# Patient Record
Sex: Female | Born: 1970 | Race: White | Hispanic: No | Marital: Married | Smoking: Former smoker
Health system: Southern US, Community
[De-identification: ages and names within clinical notes are randomized; demographics above are authoritative.]

## PROBLEM LIST (undated history)

## (undated) DIAGNOSIS — J209 Acute bronchitis, unspecified: Secondary | ICD-10-CM

## (undated) HISTORY — DX: Acute bronchitis, unspecified: J20.9

## (undated) HISTORY — PX: CERVICAL DISCECTOMY: SHX98

---

## 1997-10-20 ENCOUNTER — Inpatient Hospital Stay (HOSPITAL_COMMUNITY): Admission: AD | Admit: 1997-10-20 | Discharge: 1997-10-20 | Payer: Self-pay | Admitting: *Deleted

## 1997-10-23 ENCOUNTER — Inpatient Hospital Stay (HOSPITAL_COMMUNITY): Admission: AD | Admit: 1997-10-23 | Discharge: 1997-10-26 | Payer: Self-pay | Admitting: Obstetrics and Gynecology

## 1997-12-01 ENCOUNTER — Other Ambulatory Visit: Admission: RE | Admit: 1997-12-01 | Discharge: 1997-12-01 | Payer: Self-pay | Admitting: Obstetrics and Gynecology

## 1999-02-10 ENCOUNTER — Ambulatory Visit (HOSPITAL_COMMUNITY): Admission: RE | Admit: 1999-02-10 | Discharge: 1999-02-10 | Payer: Self-pay | Admitting: Obstetrics and Gynecology

## 1999-02-28 ENCOUNTER — Other Ambulatory Visit: Admission: RE | Admit: 1999-02-28 | Discharge: 1999-02-28 | Payer: Self-pay | Admitting: Obstetrics and Gynecology

## 1999-08-05 ENCOUNTER — Other Ambulatory Visit: Admission: RE | Admit: 1999-08-05 | Discharge: 1999-08-05 | Payer: Self-pay | Admitting: Obstetrics and Gynecology

## 2000-01-24 ENCOUNTER — Inpatient Hospital Stay (HOSPITAL_COMMUNITY): Admission: AD | Admit: 2000-01-24 | Discharge: 2000-01-26 | Payer: Self-pay | Admitting: Obstetrics and Gynecology

## 2000-02-23 ENCOUNTER — Other Ambulatory Visit: Admission: RE | Admit: 2000-02-23 | Discharge: 2000-02-23 | Payer: Self-pay | Admitting: Obstetrics and Gynecology

## 2002-03-13 ENCOUNTER — Other Ambulatory Visit: Admission: RE | Admit: 2002-03-13 | Discharge: 2002-03-13 | Payer: Self-pay | Admitting: Obstetrics and Gynecology

## 2002-09-29 ENCOUNTER — Inpatient Hospital Stay (HOSPITAL_COMMUNITY): Admission: AD | Admit: 2002-09-29 | Discharge: 2002-09-29 | Payer: Self-pay | Admitting: Obstetrics and Gynecology

## 2002-10-03 ENCOUNTER — Inpatient Hospital Stay (HOSPITAL_COMMUNITY): Admission: AD | Admit: 2002-10-03 | Discharge: 2002-10-05 | Payer: Self-pay | Admitting: Obstetrics and Gynecology

## 2002-10-31 ENCOUNTER — Other Ambulatory Visit: Admission: RE | Admit: 2002-10-31 | Discharge: 2002-10-31 | Payer: Self-pay | Admitting: Obstetrics and Gynecology

## 2006-02-12 ENCOUNTER — Ambulatory Visit: Payer: Self-pay | Admitting: Family Medicine

## 2018-06-03 ENCOUNTER — Encounter: Payer: Self-pay | Admitting: Emergency Medicine

## 2018-06-03 ENCOUNTER — Emergency Department: Payer: BC Managed Care – PPO

## 2018-06-03 ENCOUNTER — Emergency Department
Admission: EM | Admit: 2018-06-03 | Discharge: 2018-06-03 | Disposition: A | Discharge status: Home / Self Care | Payer: BC Managed Care – PPO | Attending: Emergency Medicine | Admitting: Emergency Medicine

## 2018-06-03 DIAGNOSIS — J189 Pneumonia, unspecified organism: Secondary | ICD-10-CM | POA: Insufficient documentation

## 2018-06-03 DIAGNOSIS — J181 Lobar pneumonia, unspecified organism: Secondary | ICD-10-CM

## 2018-06-03 DIAGNOSIS — R05 Cough: Secondary | ICD-10-CM | POA: Diagnosis present

## 2018-06-03 LAB — CBC WITH DIFFERENTIAL/PLATELET
Abs Immature Granulocytes: 0.09 10*3/uL — ABNORMAL HIGH (ref 0.00–0.07)
BASOS ABS: 0.1 10*3/uL (ref 0.0–0.1)
Basophils Relative: 1 %
EOS ABS: 1 10*3/uL — AB (ref 0.0–0.5)
EOS PCT: 8 %
HCT: 42.1 % (ref 36.0–46.0)
Hemoglobin: 13.3 g/dL (ref 12.0–15.0)
IMMATURE GRANULOCYTES: 1 %
Lymphocytes Relative: 16 %
Lymphs Abs: 1.9 10*3/uL (ref 0.7–4.0)
MCH: 26.8 pg (ref 26.0–34.0)
MCHC: 31.6 g/dL (ref 30.0–36.0)
MCV: 84.9 fL (ref 80.0–100.0)
Monocytes Absolute: 0.6 10*3/uL (ref 0.1–1.0)
Monocytes Relative: 5 %
NEUTROS PCT: 69 %
NRBC: 0 % (ref 0.0–0.2)
Neutro Abs: 8.1 10*3/uL — ABNORMAL HIGH (ref 1.7–7.7)
Platelets: 310 10*3/uL (ref 150–400)
RBC: 4.96 MIL/uL (ref 3.87–5.11)
RDW: 14.3 % (ref 11.5–15.5)
WBC: 11.8 10*3/uL — AB (ref 4.0–10.5)

## 2018-06-03 LAB — COMPREHENSIVE METABOLIC PANEL
ALBUMIN: 4.6 g/dL (ref 3.5–5.0)
ALT: 15 U/L (ref 0–44)
AST: 19 U/L (ref 15–41)
Alkaline Phosphatase: 58 U/L (ref 38–126)
Anion gap: 9 (ref 5–15)
BUN: 10 mg/dL (ref 6–20)
CALCIUM: 9.4 mg/dL (ref 8.9–10.3)
CO2: 25 mmol/L (ref 22–32)
Chloride: 105 mmol/L (ref 98–111)
Creatinine, Ser: 0.56 mg/dL (ref 0.44–1.00)
GFR calc Af Amer: >60 mL/min (ref >60)
GFR calc non Af Amer: >60 mL/min (ref >60)
Glucose, Bld: 105 mg/dL — ABNORMAL HIGH (ref 70–99)
POTASSIUM: 3.9 mmol/L (ref 3.5–5.1)
SODIUM: 139 mmol/L (ref 135–145)
TOTAL PROTEIN: 7.9 g/dL (ref 6.5–8.1)
Total Bilirubin: 1.1 mg/dL (ref 0.3–1.2)

## 2018-06-03 MED ORDER — LEVOFLOXACIN 750 MG PO TABS: 750.0000 mg | ORAL_TABLET | Freq: Every day | ORAL | 0 refills | Status: AC

## 2018-06-03 MED ORDER — IPRATROPIUM-ALBUTEROL 0.5-2.5 (3) MG/3ML IN SOLN
3.0000 mL | Freq: Once | RESPIRATORY_TRACT | Status: AC
  Administered 2018-06-03: 3 mL via RESPIRATORY_TRACT
  Filled 2018-06-03: qty 3

## 2018-06-03 MED ORDER — ALBUTEROL SULFATE HFA 108 (90 BASE) MCG/ACT IN AERS: 2.0000 | INHALATION_SPRAY | Freq: Four times a day (QID) | RESPIRATORY_TRACT | 2 refills | Status: DC | PRN

## 2018-06-03 MED ORDER — HYDROCOD POLST-CPM POLST ER 10-8 MG/5ML PO SUER: 5.0000 mL | Freq: Two times a day (BID) | ORAL | 0 refills | Status: DC | PRN

## 2018-06-03 MED ORDER — HYDROCOD POLST-CPM POLST ER 10-8 MG/5ML PO SUER
5.0000 mL | Freq: Once | ORAL | Status: AC
  Administered 2018-06-03: 5 mL via ORAL
  Filled 2018-06-03: qty 5

## 2018-06-03 NOTE — ED Provider Notes (Signed)
[facility] Emergency Department Provider Note  ____________________________________________   First MD Initiated Contact with Patient 06/03/18 1046     (approximate)  I have reviewed the triage vital signs and the nursing notes.   HISTORY  Chief Complaint Cough   HPI Sydney Hobbs is a 47 y.o. female patient presents to the ED with complaint of cough that started approximately 2 months ago.  She states that she was treated with antibiotics twice (Zithromax) and prednisone twice at the urgent care.  She was told by the provider that she had pneumonia on her x-ray.  She states that she began feeling better after the antibiotic but then when she discontinue the antibiotics the cough returned.  Patient called the urgent care was told to come to the ED for evaluation and treatment.  Patient denies any fever and night sweats.  Patient is a former smoker.    History reviewed. No pertinent past medical history.  There are no active problems to display for this patient.    Prior to Admission medications   Medication Sig Start Date End Date Taking? Authorizing Provider  albuterol (PROVENTIL HFA;VENTOLIN HFA) 108 (90 Base) MCG/ACT inhaler Inhale 2 puffs into the lungs every 6 (six) hours as needed for wheezing or shortness of breath. 06/03/18   Tommi Rumps, PA-C  chlorpheniramine-HYDROcodone (TUSSIONEX PENNKINETIC ER) 10-8 MG/5ML SUER Take 5 mLs by mouth every 12 (twelve) hours as needed for cough. 06/03/18   Tommi Rumps, PA-C  levofloxacin (LEVAQUIN) 750 MG tablet Take 1 tablet (750 mg total) by mouth daily for 7 days. 06/03/18 06/10/18  Tommi Rumps, PA-C    Allergies Penicillins  No family history on file.  Social History Social History   Tobacco Use  . Smoking status: Not on file  Substance Use Topics  . Alcohol use: Not on file  . Drug use: Not on file    Review of Systems Constitutional: No fever/chills Eyes: No visual changes. ENT:  No sore throat. Cardiovascular: Denies chest pain. Respiratory: Denies shortness of breath.  Positive cough. Gastrointestinal: No abdominal pain.  No nausea, no vomiting. Musculoskeletal: Negative for back pain. Skin: Negative for rash. Neurological: Negative for headaches, focal weakness or numbness. ___________________________________________   PHYSICAL EXAM:  VITAL SIGNS: ED Triage Vitals  Enc Vitals Group     BP 06/03/18 1028 132/87     Pulse Rate 06/03/18 1028 (!) 114     Resp 06/03/18 1028 20     Temp 06/03/18 1028 98.4 F (36.9 C)     Temp Source 06/03/18 1028 Oral     SpO2 06/03/18 1028 95 %     Weight 06/03/18 1029 202 lb (91.6 kg)     Height 06/03/18 1029 5\' 9"  (1.753 m)     Head Circumference --      Peak Flow --      Pain Score 06/03/18 1031 5     Pain Loc --      Pain Edu? --      Excl. in GC? --    Constitutional: Alert and oriented. Well appearing and in no acute distress. Eyes: Conjunctivae are normal.  Head: Atraumatic. Nose: No congestion/rhinnorhea.  Mouth/Throat: Mucous membranes are moist.  Oropharynx non-erythematous. Neck: No stridor.   Hematological/Lymphatic/Immunilogical: No cervical lymphadenopathy. Cardiovascular: Normal rate, regular rhythm. Grossly normal heart sounds.  Good peripheral circulation. Respiratory: Normal respiratory effort.  No retractions. Lungs no rales or rhonchi are noted however patient is noted to be coughing  almost suggestive of bronchospasm.  Patient is able talking complete sentences without any difficulties.  No wheezing is appreciated. Gastrointestinal: Soft and nontender. No distention.  Musculoskeletal: Moves upper and lower extremities without any difficulty.  Normal gait was noted. Neurologic:  Normal speech and language. No gross focal neurologic deficits are appreciated. No gait instability. Skin:  Skin is warm, dry and intact. No rash noted. Psychiatric: Mood and affect are normal. Speech and behavior are  normal.  ____________________________________________   LABS (all labs ordered are listed, but only abnormal results are displayed)  Labs Reviewed  CBC WITH DIFFERENTIAL/PLATELET - Abnormal; Notable for the following components:      Result Value   WBC 11.8 (*)    Neutro Abs 8.1 (*)    Eosinophils Absolute 1.0 (*)    Abs Immature Granulocytes 0.09 (*)    All other components within normal limits  COMPREHENSIVE METABOLIC PANEL - Abnormal; Notable for the following components:   Glucose, Bld 105 (*)    All other components within normal limits    RADIOLOGY  ED MD interpretation:  Chest x-ray was viewed and report reviewed from radiologist.  Official radiology report(s): Dg Chest 2 View  Result Date: 06/03/2018 CLINICAL DATA:  Cough since October. EXAM: CHEST - 2 VIEW COMPARISON:  None. FINDINGS: Lungs are adequately inflated with subtle density over the anterior left base which may be due to atelectasis versus infection. No effusion. Cardiomediastinal silhouette and remainder the exam is unremarkable. IMPRESSION: Minimal opacification over the anterior left base which may be due to atelectasis or infection. Electronically Signed   By: Elberta Fortis M.D.   On: 06/03/2018 12:14   ____________________________________________   PROCEDURES  Procedure(s) performed: None  Procedures  Critical Care performed: No  ____________________________________________   INITIAL IMPRESSION / ASSESSMENT AND PLAN / ED COURSE  As part of my medical decision making, I reviewed the following data within the electronic MEDICAL RECORD NUMBER Notes from prior ED visits and [place] Controlled Substance Database  Patient presents to the ED with complaint of semi-productive cough for 1 month.  She initially was seen at an urgent care and treated with Zithromax twice and prednisone.  She states that she felt better while taking the antibiotic but the cough returned after finishing the course of antibiotics.   Patient was given an nebulizer treatment while in the ED which helped with her cough along with Tussionex.  Chest x-ray was suggestive of an infiltrate in the left lower lobe.  WBC is slightly elevated at 11.8.  Patient's cough was under control at the time of discharge and patient was feeling better.  She was given a prescription for an albuterol inhaler, Levaquin 750 g daily for 7 days and Tussionex.  She is to follow-up with her PCP if any continued problems she may also return to the emergency department if any severe worsening of her symptoms.   ____________________________________________   FINAL CLINICAL IMPRESSION(S) / ED DIAGNOSES  Final diagnoses:  Community acquired pneumonia of left lower lobe of lung [facility])     ED Discharge Orders         Ordered    chlorpheniramine-HYDROcodone (TUSSIONEX PENNKINETIC ER) 10-8 MG/5ML SUER  Every 12 hours PRN     06/03/18 1318    levofloxacin (LEVAQUIN) 750 MG tablet  Daily     06/03/18 1325    albuterol (PROVENTIL HFA;VENTOLIN HFA) 108 (90 Base) MCG/ACT inhaler  Every 6 hours PRN     06/03/18 1325  Note:  This document was prepared using Dragon voice recognition software and may include unintentional dictation errors.    Tommi RumpsSummers, Clairessa Boulet L, PA-C 06/03/18 1819    Emily FilbertWilliams, Jonathan E, MD 06/04/18 [phone]

## 2018-06-03 NOTE — Discharge Instructions (Signed)
Follow-up with your primary care provider if any continued problems or [facility] acute [facility].  Begin taking Levaquin 750 mg daily for the next 7 days.  Also continue your albuterol inhaler 2 puffs 4 times a day as needed for coughing or shortness of breath.  Continue the Tussionex that you have at home and also get the prescription filled if you need more.  If not improving your primary care provider may need to order more testing such as a CT scan of your chest.  Return to the emergency department if any severe worsening of your symptoms.

## 2018-06-03 NOTE — ED Triage Notes (Signed)
Pt reports on going cough since October. Has been treated with abx, pt feels fine after abx and then when they stop the cough comes back. Pt denies fever.

## 2018-06-03 NOTE — ED Notes (Signed)
See triage note  Presents with a 2 month hx of cough  States she has been seen for same couple of times  Feels better for short while then sx's return

## 2018-06-12 ENCOUNTER — Emergency Department: Payer: BC Managed Care – PPO

## 2018-06-12 ENCOUNTER — Encounter: Payer: Self-pay | Admitting: Emergency Medicine

## 2018-06-12 ENCOUNTER — Emergency Department
Admission: EM | Admit: 2018-06-12 | Discharge: 2018-06-12 | Disposition: A | Discharge status: Home / Self Care | Payer: BC Managed Care – PPO | Attending: Emergency Medicine | Admitting: Emergency Medicine

## 2018-06-12 ENCOUNTER — Other Ambulatory Visit: Payer: Self-pay

## 2018-06-12 DIAGNOSIS — J40 Bronchitis, not specified as acute or chronic: Secondary | ICD-10-CM | POA: Insufficient documentation

## 2018-06-12 DIAGNOSIS — R0602 Shortness of breath: Secondary | ICD-10-CM | POA: Diagnosis present

## 2018-06-12 DIAGNOSIS — Z87891 Personal history of nicotine dependence: Secondary | ICD-10-CM | POA: Insufficient documentation

## 2018-06-12 DIAGNOSIS — R059 Cough, unspecified: Secondary | ICD-10-CM

## 2018-06-12 DIAGNOSIS — R05 Cough: Secondary | ICD-10-CM

## 2018-06-12 LAB — COMPREHENSIVE METABOLIC PANEL
ALT: 18 U/L (ref 0–44)
AST: 23 U/L (ref 15–41)
Albumin: 4.3 g/dL (ref 3.5–5.0)
Alkaline Phosphatase: 48 U/L (ref 38–126)
Anion gap: 7 (ref 5–15)
BUN: 8 mg/dL (ref 6–20)
CO2: 25 mmol/L (ref 22–32)
CREATININE: 0.65 mg/dL (ref 0.44–1.00)
Calcium: 8.9 mg/dL (ref 8.9–10.3)
Chloride: 106 mmol/L (ref 98–111)
GFR calc Af Amer: >60 mL/min (ref >60)
GFR calc non Af Amer: >60 mL/min (ref >60)
Glucose, Bld: 104 mg/dL — ABNORMAL HIGH (ref 70–99)
Potassium: 3.4 mmol/L — ABNORMAL LOW (ref 3.5–5.1)
Sodium: 138 mmol/L (ref 135–145)
Total Bilirubin: 1.1 mg/dL (ref 0.3–1.2)
Total Protein: 7.2 g/dL (ref 6.5–8.1)

## 2018-06-12 LAB — TROPONIN I: Troponin I: <0.03 ng/mL (ref <0.03)

## 2018-06-12 LAB — CBC WITH DIFFERENTIAL/PLATELET
Abs Immature Granulocytes: 0.06 10*3/uL (ref 0.00–0.07)
Basophils Absolute: 0.2 10*3/uL — ABNORMAL HIGH (ref 0.0–0.1)
Basophils Relative: 1 %
Eosinophils Absolute: 2.4 10*3/uL — ABNORMAL HIGH (ref 0.0–0.5)
Eosinophils Relative: 18 %
HCT: 38.2 % (ref 36.0–46.0)
Hemoglobin: 12 g/dL (ref 12.0–15.0)
Immature Granulocytes: 0 %
Lymphocytes Relative: 22 %
Lymphs Abs: 2.9 10*3/uL (ref 0.7–4.0)
MCH: 26.7 pg (ref 26.0–34.0)
MCHC: 31.4 g/dL (ref 30.0–36.0)
MCV: 84.9 fL (ref 80.0–100.0)
Monocytes Absolute: 0.7 10*3/uL (ref 0.1–1.0)
Monocytes Relative: 6 %
NEUTROS PCT: 53 %
Neutro Abs: 7.1 10*3/uL (ref 1.7–7.7)
Platelets: 268 10*3/uL (ref 150–400)
RBC: 4.5 MIL/uL (ref 3.87–5.11)
RDW: 14.2 % (ref 11.5–15.5)
WBC: 13.4 10*3/uL — ABNORMAL HIGH (ref 4.0–10.5)
nRBC: 0 % (ref 0.0–0.2)

## 2018-06-12 LAB — CG4 I-STAT (LACTIC ACID): Lactic Acid, Venous: 0.75 mmol/L (ref 0.5–1.9)

## 2018-06-12 MED ORDER — ALBUTEROL SULFATE HFA 108 (90 BASE) MCG/ACT IN AERS: 2.0000 | INHALATION_SPRAY | Freq: Four times a day (QID) | RESPIRATORY_TRACT | 0 refills | Status: DC | PRN

## 2018-06-12 MED ORDER — IPRATROPIUM-ALBUTEROL 0.5-2.5 (3) MG/3ML IN SOLN
3.0000 mL | Freq: Once | RESPIRATORY_TRACT | Status: AC
  Administered 2018-06-12: 3 mL via RESPIRATORY_TRACT
  Filled 2018-06-12: qty 3

## 2018-06-12 MED ORDER — PREDNISONE 10 MG PO TABS: 10.0000 mg | ORAL_TABLET | Freq: Every day | ORAL | 0 refills | Status: DC

## 2018-06-12 MED ORDER — METHYLPREDNISOLONE SODIUM SUCC 125 MG IJ SOLR
125.0000 mg | Freq: Once | INTRAMUSCULAR | Status: AC
  Administered 2018-06-12: 125 mg via INTRAVENOUS
  Filled 2018-06-12: qty 2

## 2018-06-12 MED ORDER — IOHEXOL 350 MG/ML SOLN
75.0000 mL | Freq: Once | INTRAVENOUS | Status: AC | PRN
  Administered 2018-06-12: 75 mL via INTRAVENOUS

## 2018-06-12 NOTE — ED Triage Notes (Signed)
Patient ambulatory to triage with steady gait, without difficulty or distress noted, mask in place; pt reports persistent prod cough yellow sputum; has completed 2 rounds of steroids and z-pak, 1 levaquin without relief of cough and SHOB; denies fever, reports upper chest soreness from coughing

## 2018-06-12 NOTE — Discharge Instructions (Addendum)
Please take your medications as prescribed.  Follow-up with your doctor in 1 week for recheck/reevaluation.  Return to the emergency department for any chest pain worsening trouble breathing.

## 2018-06-12 NOTE — ED Provider Notes (Signed)
[facility] Emergency Department Provider Note  Time seen: 9:33 PM  I have reviewed the triage vital signs and the nursing notes.   HISTORY  Chief Complaint Cough    HPI Sydney Hobbs is a 47 y.o. female with no significant past medical history presents to the emergency department for cough shortness of breath and chest pain.  According to the patient beginning in October she developed cough and fever was initially diagnosed with possible influenza by her doctor and started on Tamiflu.  Patient states she continued to cough throughout the month of October, was seen again by her doctor 1 November and was started on Zithromax.  States 2 weeks later was not improved so her doctor wrote her another course of Zithromax.  Patient states the cough and shortness of breath continued she was seen in the emergency department 2 weeks ago prescribed Levaquin.  Patient states she continues to have cough and shortness of breath so she came to the emergency department today for evaluation.  Denies any recent fevers.  Denies any nausea vomiting or diarrhea.  States frequent cough shortness of breath worse with any type of exertion.  Denies any lower extremity swelling.  Does not take estrogen, no history of DVT or PE.   History reviewed. No pertinent past medical history.  There are no active problems to display for this patient.   Past Surgical History:  Procedure Laterality Date  . CERVICAL DISCECTOMY      Prior to Admission medications   Medication Sig Start Date End Date Taking? Authorizing Provider  albuterol (PROVENTIL HFA;VENTOLIN HFA) 108 (90 Base) MCG/ACT inhaler Inhale 2 puffs into the lungs every 6 (six) hours as needed for wheezing or shortness of breath. 06/03/18   Tommi RumpsSummers, Rhonda L, PA-C  chlorpheniramine-HYDROcodone (TUSSIONEX PENNKINETIC ER) 10-8 MG/5ML SUER Take 5 mLs by mouth every 12 (twelve) hours as needed for cough. 06/03/18   Tommi RumpsSummers, Rhonda L, PA-C     Allergies  Allergen Reactions  . Penicillins Rash    No family history on file.  Social History Social History   Tobacco Use  . Smoking status: Former Games developermoker  . Smokeless tobacco: Never Used  Substance Use Topics  . Alcohol use: Not on file  . Drug use: Not on file    Review of Systems Constitutional: Fever in October, none since Cardiovascular: Negative for chest pain. Respiratory: Moderate shortness of breath worse with exertion.  Positive for frequent cough. Gastrointestinal: Negative for abdominal pain, vomiting  Genitourinary: Negative for urinary compaints Musculoskeletal: Negative for leg swelling Skin: Negative for skin complaints  Neurological: Negative for headache All other ROS negative  ____________________________________________   PHYSICAL EXAM:  VITAL SIGNS: ED Triage Vitals  Enc Vitals Group     BP 06/12/18 1938 139/83     Pulse Rate 06/12/18 1938 (!) 112     Resp 06/12/18 1938 20     Temp 06/12/18 1938 98.5 F (36.9 C)     Temp Source 06/12/18 1938 Oral     SpO2 06/12/18 1938 96 %     Weight 06/12/18 1942 200 lb (90.7 kg)     Height 06/12/18 1942 5\' 9"  (1.753 m)     Head Circumference --      Peak Flow --      Pain Score 06/12/18 1942 3     Pain Loc --      Pain Edu? --      Excl. in GC? --    Constitutional: Alert  and oriented. Well appearing and in no distress. Eyes: Normal exam ENT   Head: Normocephalic and atraumatic.   Mouth/Throat: Mucous membranes are moist. Cardiovascular: Regular rhythm, rate 100 bpm.  No obvious murmur. Respiratory: Patient is slightly tachypneic has mild rhonchi bilaterally. Gastrointestinal: Soft and nontender. No distention.  Musculoskeletal: Nontender with normal range of motion in all extremities. No lower extremity tenderness or edema.  Neurologic:  Normal speech and language. No gross focal neurologic deficits Skin:  Skin is warm, dry and intact.  Psychiatric: Mood and affect are normal.    ____________________________________________    EKG  EKG viewed and interpreted by myself shows a sinus tachycardia 115 bpm with a narrow QRS, normal axis, normal intervals, nonspecific ST changes.  ____________________________________________    RADIOLOGY  Chest x-ray shows lingular opacities  ____________________________________________   INITIAL IMPRESSION / ASSESSMENT AND PLAN / ED COURSE  Pertinent labs & imaging results that were available during my care of the patient were reviewed by me and considered in my medical decision making (see chart for details).  Patient presents to the emergency department for continued cough and dyspnea despite 3 rounds of antibiotics and 1 round of Tamiflu.  Symptoms have been ongoing for 2.5 months at this time.  Patient states she is not getting any better which prompted today's emergency department visit.  Given the patient's multiple rounds of antibiotics despite improvement although her symptoms do seem suggestive of pneumonia including cough, slightly elevated white blood cell count mild tachycardia this could also be due to pulmonary embolism or other process such as oncologic process in the chest.  Will obtain CT angiography of the chest to help evaluate for pulmonary embolism or other intrathoracic abnormality.  Patient agreeable to plan of care.  CT scan of the chest is negative for acute abnormality.  Given the patient's ongoing shortness of breath she does have mild rhonchi on exam we will start the patient on albuterol and a short course of steroids.  We will have the patient follow back up with her doctor.  No further antibiotics warranted at this time.  Patient agreeable to plan of care.  ____________________________________________   FINAL CLINICAL IMPRESSION(S) / ED DIAGNOSES  Dyspnea Bronchitis   Minna Antis, MD 06/12/18 2238

## 2018-06-13 LAB — PATHOLOGIST SMEAR REVIEW

## 2018-06-17 ENCOUNTER — Ambulatory Visit: Payer: BC Managed Care – PPO | Admitting: Internal Medicine

## 2018-06-17 ENCOUNTER — Encounter: Payer: Self-pay | Admitting: Internal Medicine

## 2018-06-17 VITALS — BP 130/80 | HR 80 | Resp 16 | Ht 71.0 in | Wt 211.0 lb

## 2018-06-17 DIAGNOSIS — R0602 Shortness of breath: Secondary | ICD-10-CM

## 2018-06-17 DIAGNOSIS — R05 Cough: Secondary | ICD-10-CM

## 2018-06-17 DIAGNOSIS — J209 Acute bronchitis, unspecified: Secondary | ICD-10-CM

## 2018-06-17 DIAGNOSIS — R059 Cough, unspecified: Secondary | ICD-10-CM

## 2018-06-17 NOTE — Patient Instructions (Signed)
Echocardiogram An echocardiogram, or echocardiography, uses sound waves (ultrasound) to produce an image of your heart. The echocardiogram is simple, painless, obtained within a short period of time, and offers valuable information to your health care provider. The images from an echocardiogram can provide information such as:  Evidence of coronary artery disease (CAD).  Heart size.  Heart muscle function.  Heart valve function.  Aneurysm detection.  Evidence of a past heart attack.  Fluid buildup around the heart.  Heart muscle thickening.  Assess heart valve function.  Tell a health care provider about:  Any allergies you have.  All medicines you are taking, including vitamins, herbs, eye drops, creams, and over-the-counter medicines.  Any problems you or family members have had with anesthetic medicines.  Any blood disorders you have.  Any surgeries you have had.  Any medical conditions you have.  Whether you are pregnant or may be pregnant. What happens before the procedure? No special preparation is needed. Eat and drink normally. What happens during the procedure?  In order to produce an image of your heart, gel will be applied to your chest and a wand-like tool (transducer) will be moved over your chest. The gel will help transmit the sound waves from the transducer. The sound waves will harmlessly bounce off your heart to allow the heart images to be captured in real-time motion. These images will then be recorded.  You may need an IV to receive a medicine that improves the quality of the pictures. What happens after the procedure? You may return to your normal schedule including diet, activities, and medicines, unless your health care provider tells you otherwise. This information is not intended to replace advice given to you by your health care provider. Make sure you discuss any questions you have with your health care provider. Document Released: 06/16/2000  Document Revised: 02/05/2016 Document Reviewed: 02/24/2013 Elsevier Interactive Patient Education  2017 [employer] Pulmonary Function Tests Pulmonary function tests (PFTs) are used to measure how well your lungs work, find out what is causing your lung problems, and figure out the best treatment for you. You may have PFTs:  When you have an illness involving the lungs.  To follow changes in your lung function over time if you have a chronic lung disease.  If you are an IT trainer. This checks the effects of being exposed to chemicals over a long period of time.  To check lung function before having surgery or other procedures.  To check your lungs if you smoke.  To check if prescribed medicines or treatments are helping your lungs.  Your results will be compared to the expected lung function of someone with healthy lungs who is similar to you in:  Age.  Gender.  Height.  Weight.  Race or ethnicity.  This is done to show how your lungs compare to normal lung function (percent predicted). This is how your health care provider knows if your lung function is normal or not. If you have had PFTs done before, your health care provider will compare your current results with past results. This shows if your lung function is better, worse, or the same as before. Tell a health care provider about:  Any allergies you have.  All medicines you are taking, including inhaler or nebulizer medicines, vitamins, herbs, eye drops, creams, and over-the-counter medicines.  Any blood disorders you have.  Any surgeries you have had, especially recent eye surgery, abdominal surgery, or chest surgery. These can make PFTs difficult  or unsafe.  Any medical conditions you have, including chest pain or heart problems, tuberculosis, or respiratory infections such as pneumonia, a cold, or the flu.  Any fear of being in closed spaces (claustrophobia). Some of your tests may be in a closed  space. What are the risks? Generally, this is a safe procedure. However, problems may occur, including:  Light-headedness due to over-breathing (hyperventilation).  An asthma attack from deep breathing.  A collapsed lung.  What happens before the procedure?  Take over-the-counter and prescription medicines only as told by your health care provider. If you take inhaler or nebulizer medicines, ask your health care provider which medicines you should take on the day of your testing. Some inhaler medicines may interfere with PFTs if they are taken shortly before the tests.  Follow your health care provider's instructions on eating and drinking restrictions. This may include avoiding eating large meals and drinking alcohol before the testing.  Do not use any products that contain nicotine or tobacco, such as cigarettes and e-cigarettes. If you need help quitting, ask your health care provider.  Wear comfortable clothing that will not interfere with breathing. What happens during the procedure?  You will be given a soft nose clip to wear. This is done so all of your breaths will go through your mouth instead of your nose.  You will be given a germ-free (sterile) mouthpiece. It will be attached to a machine that measures your breathing (spirometer).  You will be asked to do various breathing maneuvers. The maneuvers will be done by breathing in (inhaling) and breathing out (exhaling). You may be asked to repeat the maneuvers several times before the testing is done.  It is important to follow the instructions exactly to get accurate results. Make sure to blow as hard and as fast as you can when you are told to do so.  You may be given a medicine that makes the small air passages in your lungs larger (bronchodilator) after testing has been done. This medicine will make it easier for you to breathe.  The tests will be repeated after the bronchodilator has taken effect.  You will be monitored  carefully during the procedure for faintness, dizziness, trouble breathing, or any other problems. The procedure may vary among health care providers and hospitals. What happens after the procedure?  It is up to you to get your test results. Ask your health care provider, or the department that is doing the tests, when your results will be ready. After you have received your test results, talk with your health care provider about treatment options, if necessary. Summary  Pulmonary function tests (PFTs) are used to measure how well your lungs work, find out what is causing your lung problems, and figure out the best treatment for you.  Wear comfortable clothing that will not interfere with breathing.  It is up to you to get your test results. After you have received them, talk with your health care provider about treatment options, if necessary. This information is not intended to replace advice given to you by your health care provider. Make sure you discuss any questions you have with your health care provider. Document Released: 02/10/2004 Document Revised: 05/11/2016 Document Reviewed: 05/11/2016 Elsevier Interactive Patient Education  [employer]2018 [employer]

## 2018-06-17 NOTE — Progress Notes (Signed)
[facility] [address] [place], [place] [address]  Pulmonary Sleep Medicine   Office Visit Note  Patient Name: Sydney Hobbs DOB: 1970/07/22 MRN [id number]  Date of Service: 06/17/2018  Complaints/HPI: cough bronchitis esophagus patient is for evaluation for cough and congestion.  Patient has been having symptoms of bronchitis with increasing shortness of breath and wheezing.  Patient has noted no hemoptysis.  She denies having any chest pain at this time.  There is been no fevers noted no chills.  She does note increase in sputum production but again without any blood in the sputum.  She also has some heartburn symptoms.  And she has not had a recent upper GI evaluation so I think this would be reasonable to pursue also.  ROS  General: (-) fever, (-) chills, (-) night sweats, (-) weakness Skin: (-) rashes, (-) itching,. Eyes: (-) visual changes, (-) redness, (-) itching. Nose and Sinuses: (-) nasal stuffiness or itchiness, (-) postnasal drip, (-) nosebleeds, (-) sinus trouble. Mouth and Throat: (-) sore throat, (-) hoarseness. Neck: (-) swollen glands, (-) enlarged thyroid, (-) neck pain. Respiratory: + cough, (-) bloody sputum, + shortness of breath, + wheezing. Cardiovascular: - ankle swelling, (-) chest pain. Lymphatic: (-) lymph node enlargement. Neurologic: (-) numbness, (-) tingling. Psychiatric: (-) anxiety, (-) depression   Current Medication: Outpatient Encounter Medications as of 06/17/2018  Medication Sig  . albuterol (PROVENTIL HFA;VENTOLIN HFA) 108 (90 Base) MCG/ACT inhaler Inhale 2 puffs into the lungs every 6 (six) hours as needed for wheezing or shortness of breath.  . predniSONE (DELTASONE) 10 MG tablet Take 1 tablet (10 mg total) by mouth daily. Day 1-3: take 4 tablets PO daily Day 4-6: take 3 tablets PO daily Day 7-9: take 2 tablets PO daily Day 10-12: take 1 tablet PO daily  . chlorpheniramine-HYDROcodone (TUSSIONEX PENNKINETIC ER) 10-8  MG/5ML SUER Take 5 mLs by mouth every 12 (twelve) hours as needed for cough. (Patient not taking: Reported on 06/17/2018)   No facility-administered encounter medications on file as of 06/17/2018.     Surgical History: Past Surgical History:  Procedure Laterality Date  . CERVICAL DISCECTOMY      Medical History: History reviewed. No pertinent past medical history.  Family History: History reviewed. No pertinent family history.  Social History: Social History   Socioeconomic History  . Marital status: Married    Spouse name: Not on file  . Number of children: Not on file  . Years of education: Not on file  . Highest education level: Not on file  Occupational History  . Not on file  Social Needs  . Financial resource strain: Not on file  . Food insecurity:    Worry: Not on file    Inability: Not on file  . Transportation needs:    Medical: Not on file    Non-medical: Not on file  Tobacco Use  . Smoking status: Former Games developer  . Smokeless tobacco: Never Used  Substance and Sexual Activity  . Alcohol use: Not on file  . Drug use: Not on file  . Sexual activity: Not on file  Lifestyle  . Physical activity:    Days per week: Not on file    Minutes per session: Not on file  . Stress: Not on file  Relationships  . Social connections:    Talks on phone: Not on file    Gets together: Not on file    Attends religious service: Not on file    Active  member of club or organization: Not on file    Attends meetings of clubs or organizations: Not on file    Relationship status: Not on file  . Intimate partner violence:    Fear of current or ex partner: Not on file    Emotionally abused: Not on file    Physically abused: Not on file    Forced sexual activity: Not on file  Other Topics Concern  . Not on file  Social History Narrative  . Not on file    Vital Signs: Blood pressure 130/80, pulse 80, resp. rate 16, height 5\' 11"  (1.803 m), weight 211 lb (95.7 kg), last  menstrual period 06/05/2018, SpO2 97 %.  Examination: General Appearance: The patient is well-developed, well-nourished, and in no distress. Skin: Gross inspection of skin unremarkable. Head: normocephalic, no gross deformities. Eyes: no gross deformities noted. ENT: ears appear grossly normal no exudates. Neck: Supple. No thyromegaly. No LAD. Respiratory: no rhonchi noted. Cardiovascular: Normal S1 and S2 without murmur or rub. Extremities: No cyanosis. pulses are equal. Neurologic: Alert and oriented. No involuntary movements.  LABS: Recent Results (from the past 2160 hour(s))  CBC with Differential     Status: Abnormal   Collection Time: 06/03/18 12:05 PM  Result Value Ref Range   WBC 11.8 (H) 4.0 - 10.5 K/uL   RBC 4.96 3.87 - 5.11 MIL/uL   Hemoglobin 13.3 12.0 - 15.0 g/dL   HCT 16.1 09.6 - 04.5 %   MCV 84.9 80.0 - 100.0 fL   MCH 26.8 26.0 - 34.0 pg   MCHC 31.6 30.0 - 36.0 g/dL   RDW 40.9 81.1 - 91.4 %   Platelets 310 150 - 400 K/uL   nRBC 0.0 0.0 - 0.2 %   Neutrophils Relative % 69 %   Neutro Abs 8.1 (H) 1.7 - 7.7 K/uL   Lymphocytes Relative 16 %   Lymphs Abs 1.9 0.7 - 4.0 K/uL   Monocytes Relative 5 %   Monocytes Absolute 0.6 0.1 - 1.0 K/uL   Eosinophils Relative 8 %   Eosinophils Absolute 1.0 (H) 0.0 - 0.5 K/uL   Basophils Relative 1 %   Basophils Absolute 0.1 0.0 - 0.1 K/uL   Immature Granulocytes 1 %   Abs Immature Granulocytes 0.09 (H) 0.00 - 0.07 K/uL    Comment: Performed at [facility], [address] [place], [place] [address]  Comprehensive metabolic panel     Status: Abnormal   Collection Time: 06/03/18 12:05 PM  Result Value Ref Range   Sodium 139 135 - 145 mmol/L   Potassium 3.9 3.5 - 5.1 mmol/L   Chloride 105 98 - 111 mmol/L   CO2 25 22 - 32 mmol/L   Glucose, Bld 105 (H) 70 - 99 mg/dL   BUN 10 6 - 20 mg/dL   Creatinine, Ser 6.21 0.44 - 1.00 mg/dL   Calcium 9.4 8.9 - 30.8 mg/dL   Total Protein 7.9 6.5 - 8.1 g/dL   Albumin 4.6 3.5 -  5.0 g/dL   AST 19 15 - 41 U/L   ALT 15 0 - 44 U/L   Alkaline Phosphatase 58 38 - 126 U/L   Total Bilirubin 1.1 0.3 - 1.2 mg/dL   GFR calc non Af Amer >60 >60 mL/min   GFR calc Af Amer >60 >60 mL/min   Anion gap 9 5 - 15    Comment: Performed at [facility], [address], [place], [place] [address]  CBC with Differential  Status: Abnormal   Collection Time: 06/12/18  7:50 PM  Result Value Ref Range   WBC 13.4 (H) 4.0 - 10.5 K/uL   RBC 4.50 3.87 - 5.11 MIL/uL   Hemoglobin 12.0 12.0 - 15.0 g/dL   HCT 16.1 09.6 - 04.5 %   MCV 84.9 80.0 - 100.0 fL   MCH 26.7 26.0 - 34.0 pg   MCHC 31.4 30.0 - 36.0 g/dL   RDW 40.9 81.1 - 91.4 %   Platelets 268 150 - 400 K/uL   nRBC 0.0 0.0 - 0.2 %   Neutrophils Relative % 53 %   Neutro Abs 7.1 1.7 - 7.7 K/uL   Lymphocytes Relative 22 %   Lymphs Abs 2.9 0.7 - 4.0 K/uL   Monocytes Relative 6 %   Monocytes Absolute 0.7 0.1 - 1.0 K/uL   Eosinophils Relative 18 %   Eosinophils Absolute 2.4 (H) 0.0 - 0.5 K/uL   Basophils Relative 1 %   Basophils Absolute 0.2 (H) 0.0 - 0.1 K/uL   Immature Granulocytes 0 %   Abs Immature Granulocytes 0.06 0.00 - 0.07 K/uL    Comment: Performed at [facility], [address] [place], [place] [address]  Comprehensive metabolic panel     Status: Abnormal   Collection Time: 06/12/18  7:50 PM  Result Value Ref Range   Sodium 138 135 - 145 mmol/L   Potassium 3.4 (L) 3.5 - 5.1 mmol/L   Chloride 106 98 - 111 mmol/L   CO2 25 22 - 32 mmol/L   Glucose, Bld 104 (H) 70 - 99 mg/dL   BUN 8 6 - 20 mg/dL   Creatinine, Ser 6.21 0.44 - 1.00 mg/dL   Calcium 8.9 8.9 - 30.8 mg/dL   Total Protein 7.2 6.5 - 8.1 g/dL   Albumin 4.3 3.5 - 5.0 g/dL   AST 23 15 - 41 U/L   ALT 18 0 - 44 U/L   Alkaline Phosphatase 48 38 - 126 U/L   Total Bilirubin 1.1 0.3 - 1.2 mg/dL   GFR calc non Af Amer >60 >60 mL/min   GFR calc Af Amer >60 >60 mL/min   Anion gap 7 5 - 15    Comment: Performed at [facility], [address] [place], [place] [address]  Troponin I - ONCE - STAT     Status: None   Collection Time: 06/12/18  7:50 PM  Result Value Ref Range   Troponin I <0.03 <0.03 ng/mL    Comment: Performed at [facility], [address] [place], [place] [address]  Pathologist smear review     Status: None   Collection Time: 06/12/18  7:50 PM  Result Value Ref Range   Path Review Peripheral blood smear is reviewed.     Comment: Mild eosinophilia. Reviewed by Lyn Hollingshead Delila Spence, M.D. Performed at [facility], [address] [place], [place] [address]   CG4 I-STAT (Lactic acid)     Status: None   Collection Time: 06/12/18  7:54 PM  Result Value Ref Range   Lactic Acid, Venous 0.75 0.5 - 1.9 mmol/L    Radiology: Dg Chest 2 View  Result Date: 06/12/2018 CLINICAL DATA:  Cough EXAM: CHEST - 2 VIEW COMPARISON:  06/03/2018 FINDINGS: Patchy lingular opacity may reflect atelectasis scar or minimal infiltrate. This does not appear significantly changed. Normal heart size. No pneumothorax. Surgical changes in the cervical spine. IMPRESSION: Patchy lingular opacity, without significant change may reflect atelectasis, scar, or minimal infiltrate. Electronically  Signed   By: Jasmine PangKim  Fujinaga M.D.   On: 06/12/2018 20:19   Ct Angio Chest Pe W And/or Wo Contrast  Result Date: 06/12/2018 CLINICAL DATA:  Pleuritic chest pain and shortness of breath EXAM: CT ANGIOGRAPHY CHEST WITH CONTRAST TECHNIQUE: Multidetector CT imaging of the chest was performed using the standard protocol during bolus administration of intravenous contrast. Multiplanar CT image reconstructions and MIPs were obtained to evaluate the vascular anatomy. CONTRAST:  75mL OMNIPAQUE IOHEXOL 350 MG/ML SOLN COMPARISON:  None. FINDINGS: Cardiovascular: --Pulmonary arteries: Contrast injection is sufficient to demonstrate satisfactory opacification of the pulmonary arteries to the segmental level. There is no pulmonary embolus. The main  pulmonary artery is within normal limits for size. --Aorta: Limited opacification of the aorta due to bolus timing optimization for the pulmonary arteries. Conventional 3 vessel aortic branching pattern. The aortic course and caliber are normal. There is no aortic atherosclerosis. --Heart: Normal size. No pericardial effusion. Mediastinum/Nodes: No mediastinal, hilar or axillary lymphadenopathy. The visualized thyroid and thoracic esophageal course are unremarkable. Lungs/Pleura: No pulmonary nodules or masses. No pleural effusion or pneumothorax. No focal airspace consolidation. No focal pleural abnormality. Upper Abdomen: Contrast bolus timing is not optimized for evaluation of the abdominal organs. Within this limitation, the visualized organs of the upper abdomen are normal. Musculoskeletal: No chest wall abnormality. No acute or significant osseous findings. Review of the MIP images confirms the above findings. IMPRESSION: No pulmonary embolus or other acute thoracic abnormality. Electronically Signed   By: Deatra RobinsonKevin  Herman M.D.   On: 06/12/2018 22:34    No results found.  Dg Chest 2 View  Result Date: 06/12/2018 CLINICAL DATA:  Cough EXAM: CHEST - 2 VIEW COMPARISON:  06/03/2018 FINDINGS: Patchy lingular opacity may reflect atelectasis scar or minimal infiltrate. This does not appear significantly changed. Normal heart size. No pneumothorax. Surgical changes in the cervical spine. IMPRESSION: Patchy lingular opacity, without significant change may reflect atelectasis, scar, or minimal infiltrate. Electronically Signed   By: Jasmine PangKim  Fujinaga M.D.   On: 06/12/2018 20:19   Dg Chest 2 View  Result Date: 06/03/2018 CLINICAL DATA:  Cough since October. EXAM: CHEST - 2 VIEW COMPARISON:  None. FINDINGS: Lungs are adequately inflated with subtle density over the anterior left base which may be due to atelectasis versus infection. No effusion. Cardiomediastinal silhouette and remainder the exam is unremarkable.  IMPRESSION: Minimal opacification over the anterior left base which may be due to atelectasis or infection. Electronically Signed   By: Elberta Fortisaniel  Boyle M.D.   On: 06/03/2018 12:14   Ct Angio Chest Pe W And/or Wo Contrast  Result Date: 06/12/2018 CLINICAL DATA:  Pleuritic chest pain and shortness of breath EXAM: CT ANGIOGRAPHY CHEST WITH CONTRAST TECHNIQUE: Multidetector CT imaging of the chest was performed using the standard protocol during bolus administration of intravenous contrast. Multiplanar CT image reconstructions and MIPs were obtained to evaluate the vascular anatomy. CONTRAST:  75mL OMNIPAQUE IOHEXOL 350 MG/ML SOLN COMPARISON:  None. FINDINGS: Cardiovascular: --Pulmonary arteries: Contrast injection is sufficient to demonstrate satisfactory opacification of the pulmonary arteries to the segmental level. There is no pulmonary embolus. The main pulmonary artery is within normal limits for size. --Aorta: Limited opacification of the aorta due to bolus timing optimization for the pulmonary arteries. Conventional 3 vessel aortic branching pattern. The aortic course and caliber are normal. There is no aortic atherosclerosis. --Heart: Normal size. No pericardial effusion. Mediastinum/Nodes: No mediastinal, hilar or axillary lymphadenopathy. The visualized thyroid and thoracic esophageal course are unremarkable. Lungs/Pleura: No pulmonary  nodules or masses. No pleural effusion or pneumothorax. No focal airspace consolidation. No focal pleural abnormality. Upper Abdomen: Contrast bolus timing is not optimized for evaluation of the abdominal organs. Within this limitation, the visualized organs of the upper abdomen are normal. Musculoskeletal: No chest wall abnormality. No acute or significant osseous findings. Review of the MIP images confirms the above findings. IMPRESSION: No pulmonary embolus or other acute thoracic abnormality. Electronically Signed   By: Deatra Robinson M.D.   On: 06/12/2018 22:34       Assessment and Plan: Patient Active Problem List   Diagnosis Date Noted  . Acute bronchitis     1. Shortness of breath patient has progression of the symptoms.  Recommended getting evaluation of pulmonary functions as well as getting an echocardiogram to assess make sure there is no cardiac causes for her symptoms. 2. Cough likely acute bronchitis I would recommend inhalers as prescribed.  Patient likely has viral etiology discussed with the patient that if symptoms do worsen that she needs to give [place] a call. 3. Bronchospasm again evaluation for bronchospasm with pulmonary functions was studied was ordered. 4. Possible reflux patient will be set up for evaluation of a upper GI study for the possibility of reflux esophagitis as a cause also for her cough and shortness of breath.  General Counseling: I have discussed the findings of the evaluation and examination with Sydney Hobbs.  I have also discussed any further diagnostic evaluation thatmay be needed or ordered today. Sydney Hobbs verbalizes understanding of the findings of todays visit. We also reviewed her medications today and discussed drug interactions and side effects including but not limited excessive drowsiness and altered mental states. We also discussed that there is always a risk not just to her but also people around her. she has been encouraged to call the office with any questions or concerns that should arise related to todays visit.    Time spent:  I have personally obtained a history, examined the patient, evaluated laboratory and imaging results, formulated the assessment and plan and placed orders. Orders Placed This Encounter  Procedures  . DG Esophagus    Standing Status:   Future    Standing Expiration Date:   08/19/2019    Order Specific Question:   Reason for Exam (SYMPTOM  OR DIAGNOSIS REQUIRED)    Answer:   gerd    Order Specific Question:   Is patient pregnant?    Answer:   No    Order Specific Question:    Preferred imaging location?    Answer:   [facility]    Order Specific Question:   Radiology Contrast Protocol - do NOT remove file path    Answer:   [email or web address]  . ECHOCARDIOGRAM COMPLETE    Standing Status:   Future    Standing Expiration Date:   09/16/2019    Order Specific Question:   Where should this test be performed    Answer:   External    Order Specific Question:   Perflutren DEFINITY (image enhancing agent) should be administered unless hypersensitivity or allergy exist    Answer:   Administer Perflutren    Order Specific Question:   Reason for exam-Echo    Answer:   Dyspnea  786.09 / R06.00  . Pulmonary function test    Standing Status:   Future    Standing Expiration Date:   06/18/2019    Order Specific Question:   Where should this test be performed?    Answer:  [facility]     Yevonne Pax, MD [facility] Pulmonary and Critical Care Sleep medicine

## 2018-06-24 ENCOUNTER — Encounter: Payer: Self-pay | Admitting: Internal Medicine

## 2018-06-24 ENCOUNTER — Ambulatory Visit: Payer: BC Managed Care – PPO

## 2018-06-24 DIAGNOSIS — J209 Acute bronchitis, unspecified: Secondary | ICD-10-CM | POA: Insufficient documentation

## 2018-06-28 ENCOUNTER — Other Ambulatory Visit: Payer: Self-pay

## 2018-07-10 ENCOUNTER — Ambulatory Visit (INDEPENDENT_AMBULATORY_CARE_PROVIDER_SITE_OTHER): Payer: BC Managed Care – PPO | Admitting: Internal Medicine

## 2018-07-10 DIAGNOSIS — R0602 Shortness of breath: Secondary | ICD-10-CM | POA: Diagnosis not present

## 2018-07-10 DIAGNOSIS — J209 Acute bronchitis, unspecified: Secondary | ICD-10-CM

## 2018-07-10 LAB — PULMONARY FUNCTION TEST

## 2018-07-10 MED ORDER — PREDNISONE 10 MG (21) PO TBPK: ORAL_TABLET | ORAL | 0 refills | Status: DC

## 2018-07-10 MED ORDER — CLARITHROMYCIN 500 MG PO TABS: 500.0000 mg | ORAL_TABLET | Freq: Two times a day (BID) | ORAL | 0 refills | Status: DC

## 2018-07-10 NOTE — Progress Notes (Signed)
Add bixin 500 mg bid for 10 days. Also start prednisone dose pack. Take as directed for 6 days.

## 2018-07-11 NOTE — Procedures (Signed)
[facility] [address] [place], [address]  DATE OF SERVICE: July 10, 2018  Complete Pulmonary Function Testing Interpretation:  FINDINGS:  The forced vital capacity is moderately decreased.  The FEV1 is 1.09 L which is 35% of predicted and is severely decreased.  FEV1 FVC ratio is moderately decreased.  Postbronchodilator there is no significant change in the FEV1 however clinical improvement may occur in the absence of spirometric improvement.  Total lung capacity is within normal limits residual volume is increased residual volume total lung capacity ratio is increased FRC is increased.  DLCO is moderately decreased.  IMPRESSION:  This pulmonary function study is consistent with severe obstructive lung disease.  There is no significant response to bronchodilators.  Clinical improvement may still however occur in the absence of spirometric improvement.  This does not preclude the use of bronchodilators.  DLCO is reduced  Yevonne Pax, MD [facility] Pulmonary Critical Care Medicine Sleep Medicine

## 2018-07-19 ENCOUNTER — Ambulatory Visit: Payer: BC Managed Care – PPO

## 2018-07-19 DIAGNOSIS — R0602 Shortness of breath: Secondary | ICD-10-CM

## 2018-07-23 ENCOUNTER — Ambulatory Visit: Payer: BC Managed Care – PPO | Admitting: Internal Medicine

## 2018-07-23 ENCOUNTER — Encounter: Payer: Self-pay | Admitting: Internal Medicine

## 2018-07-23 VITALS — BP 128/82 | HR 105 | Resp 16 | Ht 69.0 in | Wt 203.0 lb

## 2018-07-23 DIAGNOSIS — J301 Allergic rhinitis due to pollen: Secondary | ICD-10-CM

## 2018-07-23 DIAGNOSIS — R0602 Shortness of breath: Secondary | ICD-10-CM

## 2018-07-23 DIAGNOSIS — J4540 Moderate persistent asthma, uncomplicated: Secondary | ICD-10-CM

## 2018-07-23 NOTE — Progress Notes (Signed)
[facility] [address] [place], [place] [address]  Pulmonary Sleep Medicine   Office Visit Note  Patient Name: Sydney Hobbs DOB: 1971-01-08 MRN [id number]  Date of Service: 07/23/2018  Complaints/HPI: SOB Asthma Had severe reduction in the FEV1 seasonal allergies.  Patient states that she has been having a lot of issues with her breathing.  She has been having increasing shortness of breath cough congestion.  She does have postnasal drip.  States that she was seen by her primary care physician and the primary care physician recommended going on antihistamines as well as Singulair.  I spoke to her at length back it appears she probably has seasonal allergies and environmental allergies and this should be further worked up.  I recommended to her also as far as the asthma is concerned she would be better off on a inhaled steroid as well as a Laba.  I gave her samples of trilogy today  ROS  General: (-) fever, (-) chills, (-) night sweats, (-) weakness Skin: (-) rashes, (-) itching,. Eyes: (-) visual changes, (-) redness, (-) itching. Nose and Sinuses: (-) nasal stuffiness or itchiness, (-) postnasal drip, (-) nosebleeds, (-) sinus trouble. Mouth and Throat: (-) sore throat, (-) hoarseness. Neck: (-) swollen glands, (-) enlarged thyroid, (-) neck pain. Respiratory: + cough, (-) bloody sputum, + shortness of breath, + wheezing. Cardiovascular: - ankle swelling, (-) chest pain. Lymphatic: (-) lymph node enlargement. Neurologic: (-) numbness, (-) tingling. Psychiatric: (-) anxiety, (-) depression   Current Medication: Outpatient Encounter Medications as of 07/23/2018  Medication Sig  . albuterol (PROVENTIL HFA;VENTOLIN HFA) 108 (90 Base) MCG/ACT inhaler Inhale 2 puffs into the lungs every 6 (six) hours as needed for wheezing or shortness of breath.  . [DISCONTINUED] chlorpheniramine-HYDROcodone (TUSSIONEX PENNKINETIC ER) 10-8 MG/5ML SUER Take 5 mLs by mouth every 12 (twelve)  hours as needed for cough. (Patient not taking: Reported on 06/17/2018)  . [DISCONTINUED] clarithromycin (BIAXIN) 500 MG tablet Take 1 tablet (500 mg total) by mouth 2 (two) times daily. (Patient not taking: Reported on 07/23/2018)  . [DISCONTINUED] predniSONE (STERAPRED UNI-PAK 21 TAB) 10 MG (21) TBPK tablet 6 day taper - take by mouth as directed for 6 days (Patient not taking: Reported on 07/23/2018)   No facility-administered encounter medications on file as of 07/23/2018.     Surgical History: Past Surgical History:  Procedure Laterality Date  . CERVICAL DISCECTOMY      Medical History: Past Medical History:  Diagnosis Date  . Acute bronchitis     Family History: History reviewed. No pertinent family history.  Social History: Social History   Socioeconomic History  . Marital status: Married    Spouse name: Not on file  . Number of children: Not on file  . Years of education: Not on file  . Highest education level: Not on file  Occupational History  . Not on file  Social Needs  . Financial resource strain: Not on file  . Food insecurity:    Worry: Not on file    Inability: Not on file  . Transportation needs:    Medical: Not on file    Non-medical: Not on file  Tobacco Use  . Smoking status: Former Games developer  . Smokeless tobacco: Never Used  Substance and Sexual Activity  . Alcohol use: Not on file  . Drug use: Not on file  . Sexual activity: Not on file  Lifestyle  . Physical activity:    Days per week: Not on file  Minutes per session: Not on file  . Stress: Not on file  Relationships  . Social connections:    Talks on phone: Not on file    Gets together: Not on file    Attends religious service: Not on file    Active member of club or organization: Not on file    Attends meetings of clubs or organizations: Not on file    Relationship status: Not on file  . Intimate partner violence:    Fear of current or ex partner: Not on file    Emotionally abused:  Not on file    Physically abused: Not on file    Forced sexual activity: Not on file  Other Topics Concern  . Not on file  Social History Narrative  . Not on file    Vital Signs: Blood pressure 128/82, pulse (!) 105, resp. rate 16, height 5\' 9"  (1.753 m), weight 203 lb (92.1 kg), SpO2 98 %.  Examination: General Appearance: The patient is well-developed, well-nourished, and in no distress. Skin: Gross inspection of skin unremarkable. Head: normocephalic, no gross deformities. Eyes: no gross deformities noted. ENT: ears appear grossly normal no exudates. Neck: Supple. No thyromegaly. No LAD. Respiratory: no rhonchi noted at this time. Cardiovascular: Normal S1 and S2 without murmur or rub. Extremities: No cyanosis. pulses are equal. Neurologic: Alert and oriented. No involuntary movements.  LABS: Recent Results (from the past 2160 hour(s))  CBC with Differential     Status: Abnormal   Collection Time: 06/03/18 12:05 PM  Result Value Ref Range   WBC 11.8 (H) 4.0 - 10.5 K/uL   RBC 4.96 3.87 - 5.11 MIL/uL   Hemoglobin 13.3 12.0 - 15.0 g/dL   HCT 81.1 91.4 - 78.2 %   MCV 84.9 80.0 - 100.0 fL   MCH 26.8 26.0 - 34.0 pg   MCHC 31.6 30.0 - 36.0 g/dL   RDW 95.6 21.3 - 08.6 %   Platelets 310 150 - 400 K/uL   nRBC 0.0 0.0 - 0.2 %   Neutrophils Relative % 69 %   Neutro Abs 8.1 (H) 1.7 - 7.7 K/uL   Lymphocytes Relative 16 %   Lymphs Abs 1.9 0.7 - 4.0 K/uL   Monocytes Relative 5 %   Monocytes Absolute 0.6 0.1 - 1.0 K/uL   Eosinophils Relative 8 %   Eosinophils Absolute 1.0 (H) 0.0 - 0.5 K/uL   Basophils Relative 1 %   Basophils Absolute 0.1 0.0 - 0.1 K/uL   Immature Granulocytes 1 %   Abs Immature Granulocytes 0.09 (H) 0.00 - 0.07 K/uL    Comment: Performed at [facility], [address] [place], [place] [address]  Comprehensive metabolic panel     Status: Abnormal   Collection Time: 06/03/18 12:05 PM  Result Value Ref Range   Sodium 139 135 - 145 mmol/L    Potassium 3.9 3.5 - 5.1 mmol/L   Chloride 105 98 - 111 mmol/L   CO2 25 22 - 32 mmol/L   Glucose, Bld 105 (H) 70 - 99 mg/dL   BUN 10 6 - 20 mg/dL   Creatinine, Ser 9.62 0.44 - 1.00 mg/dL   Calcium 9.4 8.9 - 95.2 mg/dL   Total Protein 7.9 6.5 - 8.1 g/dL   Albumin 4.6 3.5 - 5.0 g/dL   AST 19 15 - 41 U/L   ALT 15 0 - 44 U/L   Alkaline Phosphatase 58 38 - 126 U/L   Total Bilirubin 1.1 0.3 - 1.2 mg/dL  GFR calc non Af Amer >60 >60 mL/min   GFR calc Af Amer >60 >60 mL/min   Anion gap 9 5 - 15    Comment: Performed at [facility], [address] [place], [place] [address]  CBC with Differential     Status: Abnormal   Collection Time: 06/12/18  7:50 PM  Result Value Ref Range   WBC 13.4 (H) 4.0 - 10.5 K/uL   RBC 4.50 3.87 - 5.11 MIL/uL   Hemoglobin 12.0 12.0 - 15.0 g/dL   HCT 60.438.2 54.036.0 - 98.146.0 %   MCV 84.9 80.0 - 100.0 fL   MCH 26.7 26.0 - 34.0 pg   MCHC 31.4 30.0 - 36.0 g/dL   RDW 19.114.2 47.811.5 - 29.515.5 %   Platelets 268 150 - 400 K/uL   nRBC 0.0 0.0 - 0.2 %   Neutrophils Relative % 53 %   Neutro Abs 7.1 1.7 - 7.7 K/uL   Lymphocytes Relative 22 %   Lymphs Abs 2.9 0.7 - 4.0 K/uL   Monocytes Relative 6 %   Monocytes Absolute 0.7 0.1 - 1.0 K/uL   Eosinophils Relative 18 %   Eosinophils Absolute 2.4 (H) 0.0 - 0.5 K/uL   Basophils Relative 1 %   Basophils Absolute 0.2 (H) 0.0 - 0.1 K/uL   Immature Granulocytes 0 %   Abs Immature Granulocytes 0.06 0.00 - 0.07 K/uL    Comment: Performed at [facility], [address] [place], [place] [address]  Comprehensive metabolic panel     Status: Abnormal   Collection Time: 06/12/18  7:50 PM  Result Value Ref Range   Sodium 138 135 - 145 mmol/L   Potassium 3.4 (L) 3.5 - 5.1 mmol/L   Chloride 106 98 - 111 mmol/L   CO2 25 22 - 32 mmol/L   Glucose, Bld 104 (H) 70 - 99 mg/dL   BUN 8 6 - 20 mg/dL   Creatinine, Ser 8.650.65 0.44 - 1.00 mg/dL   Calcium 8.9 8.9 - 78.410.3 mg/dL   Total Protein 7.2 6.5 - 8.1 g/dL   Albumin 4.3 3.5 - 5.0  g/dL   AST 23 15 - 41 U/L   ALT 18 0 - 44 U/L   Alkaline Phosphatase 48 38 - 126 U/L   Total Bilirubin 1.1 0.3 - 1.2 mg/dL   GFR calc non Af Amer >60 >60 mL/min   GFR calc Af Amer >60 >60 mL/min   Anion gap 7 5 - 15    Comment: Performed at [facility], [address] [place], [place] [address]  Troponin I - ONCE - STAT     Status: None   Collection Time: 06/12/18  7:50 PM  Result Value Ref Range   Troponin I <0.03 <0.03 ng/mL    Comment: Performed at [facility], [address] [place], [place] [address]  Pathologist smear review     Status: None   Collection Time: 06/12/18  7:50 PM  Result Value Ref Range   Path Review Peripheral blood smear is reviewed.     Comment: Mild eosinophilia. Reviewed by Lyn HollingsheadErnest A. Delila SpenceAndree, M.D. Performed at [facility], [address] [place], [place] [address]   CG4 I-STAT (Lactic acid)     Status: None   Collection Time: 06/12/18  7:54 PM  Result Value Ref Range   Lactic Acid, Venous 0.75 0.5 - 1.9 mmol/L    Radiology: Dg Chest 2 View  Result Date: 06/12/2018 CLINICAL DATA:  Cough EXAM: CHEST -  2 VIEW COMPARISON:  06/03/2018 FINDINGS: Patchy lingular opacity may reflect atelectasis scar or minimal infiltrate. This does not appear significantly changed. Normal heart size. No pneumothorax. Surgical changes in the cervical spine. IMPRESSION: Patchy lingular opacity, without significant change may reflect atelectasis, scar, or minimal infiltrate. Electronically Signed   By: Jasmine Pang M.D.   On: 06/12/2018 20:19   Ct Angio Chest Pe W And/or Wo Contrast  Result Date: 06/12/2018 CLINICAL DATA:  Pleuritic chest pain and shortness of breath EXAM: CT ANGIOGRAPHY CHEST WITH CONTRAST TECHNIQUE: Multidetector CT imaging of the chest was performed using the standard protocol during bolus administration of intravenous contrast. Multiplanar CT image reconstructions and MIPs were obtained to evaluate the vascular anatomy.  CONTRAST:  41mL OMNIPAQUE IOHEXOL 350 MG/ML SOLN COMPARISON:  None. FINDINGS: Cardiovascular: --Pulmonary arteries: Contrast injection is sufficient to demonstrate satisfactory opacification of the pulmonary arteries to the segmental level. There is no pulmonary embolus. The main pulmonary artery is within normal limits for size. --Aorta: Limited opacification of the aorta due to bolus timing optimization for the pulmonary arteries. Conventional 3 vessel aortic branching pattern. The aortic course and caliber are normal. There is no aortic atherosclerosis. --Heart: Normal size. No pericardial effusion. Mediastinum/Nodes: No mediastinal, hilar or axillary lymphadenopathy. The visualized thyroid and thoracic esophageal course are unremarkable. Lungs/Pleura: No pulmonary nodules or masses. No pleural effusion or pneumothorax. No focal airspace consolidation. No focal pleural abnormality. Upper Abdomen: Contrast bolus timing is not optimized for evaluation of the abdominal organs. Within this limitation, the visualized organs of the upper abdomen are normal. Musculoskeletal: No chest wall abnormality. No acute or significant osseous findings. Review of the MIP images confirms the above findings. IMPRESSION: No pulmonary embolus or other acute thoracic abnormality. Electronically Signed   By: Deatra Robinson M.D.   On: 06/12/2018 22:34    No results found.  No results found.    Assessment and Plan: Patient Active Problem List   Diagnosis Date Noted  . Acute bronchitis     1. Asthma persistant FEV1 very low on the PFT. Add LABA and steroid Samples of trelegy given we will see how she does with the inhaler.  I suspect most of her problem is due to extrinsic allergens.  Once we get the allergy test we may have a better idea as to what to target.  We will see how she responds to the Trelegy 2. Allergic rhinitis will schedule for allergy test for environmental including pollens as well as molds we will continue  with supportive care 3. SOB likely factor from her persistent asthma  General Counseling: I have discussed the findings of the evaluation and examination with Lawson Fiscal.  I have also discussed any further diagnostic evaluation thatmay be needed or ordered today. Naydene verbalizes understanding of the findings of todays visit. We also reviewed her medications today and discussed drug interactions and side effects including but not limited excessive drowsiness and altered mental states. We also discussed that there is always a risk not just to her but also people around her. she has been encouraged to call the office with any questions or concerns that should arise related to todays visit.    Time spent: 15 minutes  I have personally obtained a history, examined the patient, evaluated laboratory and imaging results, formulated the assessment and plan and placed orders.    Yevonne Pax, MD [facility] Pulmonary and Critical Care Sleep medicine

## 2018-07-24 ENCOUNTER — Other Ambulatory Visit: Payer: Self-pay | Admitting: Internal Medicine

## 2018-07-24 DIAGNOSIS — J301 Allergic rhinitis due to pollen: Secondary | ICD-10-CM

## 2018-07-25 ENCOUNTER — Telehealth: Payer: Self-pay

## 2018-07-25 NOTE — Telephone Encounter (Signed)
Pt called that side effect off trelegy she having rash as per dr Welton Flakes advised stopped and we can wait for new inhaler until Monday on next appt

## 2018-07-29 ENCOUNTER — Encounter: Payer: Self-pay | Admitting: Internal Medicine

## 2018-07-29 ENCOUNTER — Ambulatory Visit (INDEPENDENT_AMBULATORY_CARE_PROVIDER_SITE_OTHER): Payer: BC Managed Care – PPO | Admitting: Internal Medicine

## 2018-07-29 ENCOUNTER — Other Ambulatory Visit: Payer: Self-pay | Admitting: Internal Medicine

## 2018-07-29 VITALS — BP 118/82 | HR 95 | Resp 16 | Ht 69.0 in | Wt 202.0 lb

## 2018-07-29 DIAGNOSIS — J301 Allergic rhinitis due to pollen: Secondary | ICD-10-CM

## 2018-07-29 MED ORDER — MONTELUKAST SODIUM 10 MG PO TABS: 10.0000 mg | ORAL_TABLET | Freq: Every day | ORAL | 3 refills | Status: DC

## 2018-07-29 MED ORDER — DESLORATADINE 5 MG PO TABS: 5.0000 mg | ORAL_TABLET | Freq: Every day | ORAL | 3 refills | Status: DC

## 2018-07-29 MED ORDER — FLUTICASONE PROPIONATE 50 MCG/ACT NA SUSP: 2.0000 | Freq: Every day | NASAL | 3 refills | Status: DC

## 2018-07-29 NOTE — Progress Notes (Addendum)
Patient here for allergy test .  See attach note

## 2018-07-29 NOTE — Procedures (Signed)
    [facility] Allergy MQT Recording Form  [facility] [address] [place], [place] [address] Phone [phone] Fax [phone]   Patient Name: Sydney GraceLori B Goupil Age: 48 y.o. Sex: female Date of Service: @SERVICEDATE @   Performing Provider: Yevonne PaxSaadat A Khan MD [facility]         Battery A Back   Site Antigen [facility] Flare  A1 Positive Control 7 12  A2 Negative Control 4 5  A3 American Elm 5 7  A4 Maple Box Elder 5 5  A5 Grass Mix 5 6  A6 Dock Sorrel Mix 7 11  A7 Russian Thistle 7 8  A8 Ragweed Mix 5 6  A9 English Pantain 0 0  A10 Oak Mix  0 0   Battery B Wheal Flare  B1 Lambs Quarters 7 15  B2 Cottonwood 9 16  B3 Pigweed Mix 7 10  B4 Acacia 0 0  B5 Pine Mix 0 0  B6 Privet 9 17  B7 White/Red Mulberry *7** 16  B8 Western Water Hemp 7 14  B9 French Southern TerritoriesBermuda Grass 6 9  B10 Melalucea 0 0   Battery C Wheal Flare  C1 Red River Birch 7 29  C2 Eastern Sycamore 8 21  C3 Bahai Grass 7 16  C4 American Beech 0 0  C5 Ash Mix 0 0  C6 Black Willow 7 14  C7 Hickory 7 11  C8 Black Walnut 7 11  C9 Red Cedar 0 0  C10 Sweet Gum  0 0   Battery D Wheal Flare  D1 Cultivated Oat 10 19  D2 Dog Fennel 7 14  D3 Common Mugwort 7 9  D4 Marsh Elder 9 11  D5 Johnson 7 16  D6 Hackberry Tree 7 15  D7 Bayberry Tree 9 11  D8 Cypress, Bald Tree 0 0  D9 Aspergillus Fumigatus 10 19  D10 Alternia  9 17   Battery E Wheal Flare  E1 Dreschlere 9 12  E2 Fusarium Mix 0 0  E3 Cladosporum Sph 0 0  E4 Bipolaris 7 16  E5 Penicillin chrys 7 12  E6 Cladosporum Herb 7 14  E7 Candida 0 0  E8 Aureobasidium 9 13  E9 Rhizopus 7 11  E10 Botrytis  9 19   Battery F Wheal Flare  F1 Aspergillus Luxembourgiger 8 14  F2 Dust Mite Mix 7 12  F3 Cockroach Mix 7 9  F4 Cat Hair 0 0  F5 Dog Mixed breeds 7 9  F6 Feather Mix 7 14

## 2018-09-03 ENCOUNTER — Encounter: Payer: Self-pay | Admitting: Internal Medicine

## 2018-09-03 ENCOUNTER — Ambulatory Visit: Payer: BC Managed Care – PPO | Admitting: Internal Medicine

## 2018-09-03 VITALS — BP 106/76 | HR 84 | Resp 16 | Ht 69.0 in | Wt 205.0 lb

## 2018-09-03 DIAGNOSIS — J4540 Moderate persistent asthma, uncomplicated: Secondary | ICD-10-CM

## 2018-09-03 DIAGNOSIS — R0602 Shortness of breath: Secondary | ICD-10-CM | POA: Diagnosis not present

## 2018-09-03 DIAGNOSIS — J301 Allergic rhinitis due to pollen: Secondary | ICD-10-CM | POA: Diagnosis not present

## 2018-09-03 MED ORDER — FLUTICASONE PROPIONATE 50 MCG/ACT NA SUSP: 2.0000 | Freq: Every day | NASAL | 5 refills | Status: DC

## 2018-09-03 MED ORDER — MONTELUKAST SODIUM 10 MG PO TABS: 10.0000 mg | ORAL_TABLET | Freq: Every day | ORAL | 5 refills | Status: DC

## 2018-09-03 MED ORDER — DESLORATADINE 5 MG PO TABS: 5.0000 mg | ORAL_TABLET | Freq: Every day | ORAL | 5 refills | Status: DC

## 2018-09-03 NOTE — Progress Notes (Signed)
[facility] [address] [place], [place] [address]  Pulmonary Sleep Medicine   Office Visit Note  Patient Name: Sydney Hobbs DOB: [birth year] MRN [id number]  Date of Service: 09/03/2018  Complaints/HPI: Pt is here for follow up on allergies. She has been taking singular, dulera, flonase and clarinx.  She reports excellent results with this combination of medications.  She is disheartened because her Elwin Sleight is not covered by her insurance and will cost her $300 a month.  The patient reports that she cannot afford this at this time.  She is very happy with her current medication regimen and will continue this and follow-up in a few months.  If for some reason she decides to do allergy injections she will let us know.  ROS  General: (-) fever, (-) chills, (-) night sweats, (-) weakness Skin: (-) rashes, (-) itching,. Eyes: (-) visual changes, (-) redness, (-) itching. Nose and Sinuses: (-) nasal stuffiness or itchiness, (-) postnasal drip, (-) nosebleeds, (-) sinus trouble. Mouth and Throat: (-) sore throat, (-) hoarseness. Neck: (-) swollen glands, (-) enlarged thyroid, (-) neck pain. Respiratory: - cough, (-) bloody sputum, - shortness of breath, - wheezing. Cardiovascular: - ankle swelling, (-) chest pain. Lymphatic: (-) lymph node enlargement. Neurologic: (-) numbness, (-) tingling. Psychiatric: (-) anxiety, (-) depression   Current Medication: Outpatient Encounter Medications as of 09/03/2018  Medication Sig  . albuterol (PROVENTIL HFA;VENTOLIN HFA) 108 (90 Base) MCG/ACT inhaler Inhale 2 puffs into the lungs every 6 (six) hours as needed for wheezing or shortness of breath.  . desloratadine (CLARINEX) 5 MG tablet Take 1 tablet (5 mg total) by mouth daily.  . fluticasone (FLONASE) 50 MCG/ACT nasal spray Place 2 sprays into both nostrils daily.  . mometasone-formoterol (DULERA) 100-5 MCG/ACT AERO Inhale 1 puff into the lungs 2 (two) times daily.  . montelukast  (SINGULAIR) 10 MG tablet Take 1 tablet (10 mg total) by mouth at bedtime.   No facility-administered encounter medications on file as of 09/03/2018.     Surgical History: Past Surgical History:  Procedure Laterality Date  . CERVICAL DISCECTOMY      Medical History: Past Medical History:  Diagnosis Date  . Acute bronchitis     Family History: No family history on file.  Social History: Social History   Socioeconomic History  . Marital status: Married    Spouse name: Not on file  . Number of children: Not on file  . Years of education: Not on file  . Highest education level: Not on file  Occupational History  . Not on file  Social Needs  . Financial resource strain: Not on file  . Food insecurity:    Worry: Not on file    Inability: Not on file  . Transportation needs:    Medical: Not on file    Non-medical: Not on file  Tobacco Use  . Smoking status: Former Games developer  . Smokeless tobacco: Never Used  Substance and Sexual Activity  . Alcohol use: Not on file  . Drug use: Not on file  . Sexual activity: Not on file  Lifestyle  . Physical activity:    Days per week: Not on file    Minutes per session: Not on file  . Stress: Not on file  Relationships  . Social connections:    Talks on phone: Not on file    Gets together: Not on file    Attends religious service: Not on file    Active member of club  or organization: Not on file    Attends meetings of clubs or organizations: Not on file    Relationship status: Not on file  . Intimate partner violence:    Fear of current or ex partner: Not on file    Emotionally abused: Not on file    Physically abused: Not on file    Forced sexual activity: Not on file  Other Topics Concern  . Not on file  Social History Narrative  . Not on file    Vital Signs: Blood pressure 106/76, pulse 84, resp. rate 16, height 5\' 9"  (1.753 m), weight 205 lb (93 kg), SpO2 98 %.  Examination: General Appearance: The patient is  well-developed, well-nourished, and in no distress. Skin: Gross inspection of skin unremarkable. Head: normocephalic, no gross deformities. Eyes: no gross deformities noted. ENT: ears appear grossly normal no exudates. Neck: Supple. No thyromegaly. No LAD. Respiratory: clear bilateraly. Cardiovascular: Normal S1 and S2 without murmur or rub. Extremities: No cyanosis. pulses are equal. Neurologic: Alert and oriented. No involuntary movements.  LABS: Recent Results (from the past 2160 hour(s))  CBC with Differential     Status: Abnormal   Collection Time: 06/12/18  7:50 PM  Result Value Ref Range   WBC 13.4 (H) 4.0 - 10.5 K/uL   RBC 4.50 3.87 - 5.11 MIL/uL   Hemoglobin 12.0 12.0 - 15.0 g/dL   HCT 80.0 34.9 - 17.9 %   MCV 84.9 80.0 - 100.0 fL   MCH 26.7 26.0 - 34.0 pg   MCHC 31.4 30.0 - 36.0 g/dL   RDW 15.0 56.9 - 79.4 %   Platelets 268 150 - 400 K/uL   nRBC 0.0 0.0 - 0.2 %   Neutrophils Relative % 53 %   Neutro Abs 7.1 1.7 - 7.7 K/uL   Lymphocytes Relative 22 %   Lymphs Abs 2.9 0.7 - 4.0 K/uL   Monocytes Relative 6 %   Monocytes Absolute 0.7 0.1 - 1.0 K/uL   Eosinophils Relative 18 %   Eosinophils Absolute 2.4 (H) 0.0 - 0.5 K/uL   Basophils Relative 1 %   Basophils Absolute 0.2 (H) 0.0 - 0.1 K/uL   Immature Granulocytes 0 %   Abs Immature Granulocytes 0.06 0.00 - 0.07 K/uL    Comment: Performed at [facility], [address] [place], [place] [address]  Comprehensive metabolic panel     Status: Abnormal   Collection Time: 06/12/18  7:50 PM  Result Value Ref Range   Sodium 138 135 - 145 mmol/L   Potassium 3.4 (L) 3.5 - 5.1 mmol/L   Chloride 106 98 - 111 mmol/L   CO2 25 22 - 32 mmol/L   Glucose, Bld 104 (H) 70 - 99 mg/dL   BUN 8 6 - 20 mg/dL   Creatinine, Ser 5.37 0.44 - 1.00 mg/dL   Calcium 8.9 8.9 - 48.2 mg/dL   Total Protein 7.2 6.5 - 8.1 g/dL   Albumin 4.3 3.5 - 5.0 g/dL   AST 23 15 - 41 U/L   ALT 18 0 - 44 U/L   Alkaline Phosphatase 48 38 - 126 U/L    Total Bilirubin 1.1 0.3 - 1.2 mg/dL   GFR calc non Af Amer >60 >60 mL/min   GFR calc Af Amer >60 >60 mL/min   Anion gap 7 5 - 15    Comment: Performed at [facility], [address], [place], [place] [address]  Troponin I - ONCE - STAT     Status:  None   Collection Time: 06/12/18  7:50 PM  Result Value Ref Range   Troponin I <0.03 <0.03 ng/mL    Comment: Performed at [facility], [address] [place], [place] [address]  Pathologist smear review     Status: None   Collection Time: 06/12/18  7:50 PM  Result Value Ref Range   Path Review Peripheral blood smear is reviewed.     Comment: Mild eosinophilia. Reviewed by Lyn HollingsheadErnest A. Delila SpenceAndree, M.D. Performed at [facility], [address] [place], [place] [address]   CG4 I-STAT (Lactic acid)     Status: None   Collection Time: 06/12/18  7:54 PM  Result Value Ref Range   Lactic Acid, Venous 0.75 0.5 - 1.9 mmol/L  Pulmonary function test     Status: None   Collection Time: 07/10/18  3:00 PM  Result Value Ref Range   FEV1     FVC     FEV1/FVC     TLC     DLCO      Radiology: Dg Chest 2 View  Result Date: 06/12/2018 CLINICAL DATA:  Cough EXAM: CHEST - 2 VIEW COMPARISON:  06/03/2018 FINDINGS: Patchy lingular opacity may reflect atelectasis scar or minimal infiltrate. This does not appear significantly changed. Normal heart size. No pneumothorax. Surgical changes in the cervical spine. IMPRESSION: Patchy lingular opacity, without significant change may reflect atelectasis, scar, or minimal infiltrate. Electronically Signed   By: Jasmine PangKim  Fujinaga M.D.   On: 06/12/2018 20:19   Ct Angio Chest Pe W And/or Wo Contrast  Result Date: 06/12/2018 CLINICAL DATA:  Pleuritic chest pain and shortness of breath EXAM: CT ANGIOGRAPHY CHEST WITH CONTRAST TECHNIQUE: Multidetector CT imaging of the chest was performed using the standard protocol during bolus administration of intravenous contrast. Multiplanar CT image  reconstructions and MIPs were obtained to evaluate the vascular anatomy. CONTRAST:  75mL OMNIPAQUE IOHEXOL 350 MG/ML SOLN COMPARISON:  None. FINDINGS: Cardiovascular: --Pulmonary arteries: Contrast injection is sufficient to demonstrate satisfactory opacification of the pulmonary arteries to the segmental level. There is no pulmonary embolus. The main pulmonary artery is within normal limits for size. --Aorta: Limited opacification of the aorta due to bolus timing optimization for the pulmonary arteries. Conventional 3 vessel aortic branching pattern. The aortic course and caliber are normal. There is no aortic atherosclerosis. --Heart: Normal size. No pericardial effusion. Mediastinum/Nodes: No mediastinal, hilar or axillary lymphadenopathy. The visualized thyroid and thoracic esophageal course are unremarkable. Lungs/Pleura: No pulmonary nodules or masses. No pleural effusion or pneumothorax. No focal airspace consolidation. No focal pleural abnormality. Upper Abdomen: Contrast bolus timing is not optimized for evaluation of the abdominal organs. Within this limitation, the visualized organs of the upper abdomen are normal. Musculoskeletal: No chest wall abnormality. No acute or significant osseous findings. Review of the MIP images confirms the above findings. IMPRESSION: No pulmonary embolus or other acute thoracic abnormality. Electronically Signed   By: Deatra RobinsonKevin  Herman M.D.   On: 06/12/2018 22:34    No results found.  No results found.    Assessment and Plan: Patient Active Problem List   Diagnosis Date Noted  . Acute bronchitis    1. Moderate persistent asthma without complication Refilled medications.  Continue current therapy.  Pt's Elwin SleightDulera is prohibitively expensive.  Gave her another sample of dulera.  Also gave her a Symbicort to try as it is covered by her insurance. She will let us know if she gets the same results.   - desloratadine (CLARINEX) 5 MG tablet;  Take 1 tablet (5 mg total) by  mouth daily.  Dispense: 30 tablet; Refill: 5 - fluticasone (FLONASE) 50 MCG/ACT nasal spray; Place 2 sprays into both nostrils daily.  Dispense: 16 g; Refill: 5 - montelukast (SINGULAIR) 10 MG tablet; Take 1 tablet (10 mg total) by mouth at bedtime.  Dispense: 30 tablet; Refill: 5  2. Seasonal allergic rhinitis due to pollen Stable, continue current medications.   3. SOB (shortness of breath)    General Counseling: I have discussed the findings of the evaluation and examination with Lawson Fiscal.  I have also discussed any further diagnostic evaluation thatmay be needed or ordered today. Darrin verbalizes understanding of the findings of todays visit. We also reviewed her medications today and discussed drug interactions and side effects including but not limited excessive drowsiness and altered mental states. We also discussed that there is always a risk not just to her but also people around her. she has been encouraged to call the office with any questions or concerns that should arise related to todays visit.    Time spent: 25 This patient was seen by Blima Ledger AGNP-C in Collaboration with Dr. Freda Munro as a part of collaborative care agreement.   I have personally obtained a history, examined the patient, evaluated laboratory and imaging results, formulated the assessment and plan and placed orders.    Yevonne Pax, MD [facility] Pulmonary and Critical Care Sleep medicine

## 2018-09-03 NOTE — Patient Instructions (Signed)
Asthma, Adult    Asthma is a long-term (chronic) condition in which the airways get tight and narrow. The airways are the breathing passages that lead from the nose and mouth down into the lungs. A person with asthma will have times when symptoms get worse. These are called asthma attacks. They can cause coughing, whistling sounds when you breathe (wheezing), shortness of breath, and chest pain. They can make it hard to breathe. There is no cure for asthma, but medicines and lifestyle changes can help control it.  There are many things that can bring on an asthma attack or make asthma symptoms worse (triggers). Common triggers include:  · Mold.  · Dust.  · Cigarette smoke.  · Cockroaches.  · Things that can cause allergy symptoms (allergens). These include animal skin flakes (dander) and pollen from trees or grass.  · Things that pollute the air. These may include household cleaners, wood smoke, smog, or chemical odors.  · Cold air, weather changes, and wind.  · Crying or laughing hard.  · Stress.  · Certain medicines or drugs.  · Certain foods such as dried fruit, potato chips, and grape juice.  · Infections, such as a cold or the flu.  · Certain medical conditions or diseases.  · Exercise or tiring activities.  Asthma may be treated with medicines and by staying away from the things that cause asthma attacks. Types of medicines may include:  · Controller medicines. These help prevent asthma symptoms. They are usually taken every day.  · Fast-acting reliever or rescue medicines. These quickly relieve asthma symptoms. They are used as needed and provide short-term relief.  · Allergy medicines if your attacks are brought on by allergens.  · Medicines to help control the body's defense (immune) system.  Follow these instructions at home:  Avoiding triggers in your home  · Change your heating and air conditioning filter often.  · Limit your use of fireplaces and wood stoves.  · Get rid of pests (such as roaches and  mice) and their droppings.  · Throw away plants if you see mold on them.  · Clean your floors. Dust regularly. Use cleaning products that do not smell.  · Have someone vacuum when you are not home. Use a vacuum cleaner with a HEPA filter if possible.  · Replace carpet with wood, tile, or vinyl flooring. Carpet can trap animal skin flakes and dust.  · Use allergy-proof pillows, mattress covers, and box spring covers.  · Wash bed sheets and blankets every week in hot water. Dry them in a dryer.  · Keep your bedroom free of any triggers.   · Avoid pets and keep windows closed when things that cause allergy symptoms are in the air.  · Use blankets that are made of polyester or cotton.  · Clean bathrooms and kitchens with bleach. If possible, have someone repaint the walls in these rooms with mold-resistant paint. Keep out of the rooms that are being cleaned and painted.  · Wash your hands often with soap and water. If soap and water are not available, use hand sanitizer.  · Do not allow anyone to smoke in your home.  General instructions  · Take over-the-counter and prescription medicines only as told by your doctor.  ? Talk with your doctor if you have questions about how or when to take your medicines.  ? Make note if you need to use your medicines more often than usual.  · Do not use any products that   contain nicotine or tobacco, such as cigarettes and e-cigarettes. If you need help quitting, ask your doctor.  · Stay away from secondhand smoke.  · Avoid doing things outdoors when allergen counts are high and when air quality is low.  · Wear a ski mask when doing outdoor activities in the winter. The mask should cover your nose and mouth. Exercise indoors on cold days if you can.  · Warm up before you exercise. Take time to cool down after exercise.  · Use a peak flow meter as told by your doctor. A peak flow meter is a tool that measures how well the lungs are working.  · Keep track of the peak flow meter's readings.  Write them down.  · Follow your asthma action plan. This is a written plan for taking care of your asthma and treating your attacks.  · Make sure you get all the shots (vaccines) that your doctor recommends. Ask your doctor about a flu shot and a pneumonia shot.  · Keep all follow-up visits as told by your doctor. This is important.  Contact a doctor if:  · You have wheezing, shortness of breath, or a cough even while taking medicine to prevent attacks.  · The mucus you cough up (sputum) is thicker than usual.  · The mucus you cough up changes from clear or white to yellow, green, gray, or bloody.  · You have problems from the medicine you are taking, such as:  ? A rash.  ? Itching.  ? Swelling.  ? Trouble breathing.  · You need reliever medicines more than 2-3 times a week.  · Your peak flow reading is still at 50-79% of your personal best after following the action plan for 1 hour.  · You have a fever.  Get help right away if:  · You seem to be worse and are not responding to medicine during an asthma attack.  · You are short of breath even at rest.  · You get short of breath when doing very little activity.  · You have trouble eating, drinking, or talking.  · You have chest pain or tightness.  · You have a fast heartbeat.  · Your lips or fingernails start to turn blue.  · You are light-headed or dizzy, or you faint.  · Your peak flow is less than 50% of your personal best.  · You feel too tired to breathe normally.  Summary  · Asthma is a long-term (chronic) condition in which the airways get tight and narrow. An asthma attack can make it hard to breathe.  · Asthma cannot be cured, but medicines and lifestyle changes can help control it.  · Make sure you understand how to avoid triggers and how and when to use your medicines.  This information is not intended to replace advice given to you by your health care provider. Make sure you discuss any questions you have with your health care provider.  Document  Released: 12/06/2007 Document Revised: 07/24/2016 Document Reviewed: 07/24/2016  [employer] Interactive Patient Education © 2019 [employer]

## 2019-03-18 ENCOUNTER — Encounter: Payer: Self-pay | Admitting: Internal Medicine

## 2019-03-18 ENCOUNTER — Ambulatory Visit: Payer: BC Managed Care – PPO | Admitting: Internal Medicine

## 2019-03-18 ENCOUNTER — Other Ambulatory Visit: Payer: Self-pay

## 2019-03-18 VITALS — BP 118/78 | HR 85 | Resp 16 | Ht 69.0 in | Wt 215.0 lb

## 2019-03-18 DIAGNOSIS — J301 Allergic rhinitis due to pollen: Secondary | ICD-10-CM

## 2019-03-18 DIAGNOSIS — J4540 Moderate persistent asthma, uncomplicated: Secondary | ICD-10-CM

## 2019-03-18 DIAGNOSIS — R0602 Shortness of breath: Secondary | ICD-10-CM | POA: Diagnosis not present

## 2019-03-18 MED ORDER — MONTELUKAST SODIUM 10 MG PO TABS: 10.0000 mg | ORAL_TABLET | Freq: Every day | ORAL | 5 refills | Status: DC

## 2019-03-18 MED ORDER — DESLORATADINE 5 MG PO TABS: 5.0000 mg | ORAL_TABLET | Freq: Every day | ORAL | 5 refills | Status: DC

## 2019-03-18 MED ORDER — FLUTICASONE PROPIONATE 50 MCG/ACT NA SUSP: 2.0000 | Freq: Every day | NASAL | 5 refills | Status: DC

## 2019-03-18 NOTE — Progress Notes (Signed)
[facility] [address] [place], [place] [address]  Pulmonary Sleep Medicine   Office Visit Note  Patient Name: Sydney GraceLori B Hobbs DOB: 08/16/1970 MRN [id number]  Date of Service: 03/18/2019  Complaints/HPI: Pt is here for follow up.  She reports on a few humid mornings, she has needed her rescue inhaler.  She reports after using it once, she is fine the rest of the day.  Overall she is doing very well.  She denies any overt issues or new complaints.  Her allergies appear controlled at this time.   ROS  General: (-) fever, (-) chills, (-) night sweats, (-) weakness Skin: (-) rashes, (-) itching,. Eyes: (-) visual changes, (-) redness, (-) itching. Nose and Sinuses: (-) nasal stuffiness or itchiness, (-) postnasal drip, (-) nosebleeds, (-) sinus trouble. Mouth and Throat: (-) sore throat, (-) hoarseness. Neck: (-) swollen glands, (-) enlarged thyroid, (-) neck pain. Respiratory: - cough, (-) bloody sputum, - shortness of breath, - wheezing. Cardiovascular: - ankle swelling, (-) chest pain. Lymphatic: (-) lymph node enlargement. Neurologic: (-) numbness, (-) tingling. Psychiatric: (-) anxiety, (-) depression   Current Medication: Outpatient Encounter Medications as of 03/18/2019  Medication Sig  . albuterol (PROVENTIL HFA;VENTOLIN HFA) 108 (90 Base) MCG/ACT inhaler Inhale 2 puffs into the lungs every 6 (six) hours as needed for wheezing or shortness of breath.  . desloratadine (CLARINEX) 5 MG tablet Take 1 tablet (5 mg total) by mouth daily.  . fluticasone (FLONASE) 50 MCG/ACT nasal spray Place 2 sprays into both nostrils daily.  . mometasone-formoterol (DULERA) 100-5 MCG/ACT AERO Inhale 1 puff into the lungs 2 (two) times daily.  . montelukast (SINGULAIR) 10 MG tablet Take 1 tablet (10 mg total) by mouth at bedtime.   No facility-administered encounter medications on file as of 03/18/2019.     Surgical History: Past Surgical History:  Procedure Laterality Date  .  CERVICAL DISCECTOMY      Medical History: Past Medical History:  Diagnosis Date  . Acute bronchitis     Family History: No family history on file.  Social History: Social History   Socioeconomic History  . Marital status: Married    Spouse name: Not on file  . Number of children: Not on file  . Years of education: Not on file  . Highest education level: Not on file  Occupational History  . Not on file  Social Needs  . Financial resource strain: Not on file  . Food insecurity    Worry: Not on file    Inability: Not on file  . Transportation needs    Medical: Not on file    Non-medical: Not on file  Tobacco Use  . Smoking status: Former Games developermoker  . Smokeless tobacco: Never Used  Substance and Sexual Activity  . Alcohol use: Not on file  . Drug use: Not on file  . Sexual activity: Not on file  Lifestyle  . Physical activity    Days per week: Not on file    Minutes per session: Not on file  . Stress: Not on file  Relationships  . Social Musicianconnections    Talks on phone: Not on file    Gets together: Not on file    Attends religious service: Not on file    Active member of club or organization: Not on file    Attends meetings of clubs or organizations: Not on file    Relationship status: Not on file  . Intimate partner violence    Fear of  current or ex partner: Not on file    Emotionally abused: Not on file    Physically abused: Not on file    Forced sexual activity: Not on file  Other Topics Concern  . Not on file  Social History Narrative  . Not on file    Vital Signs: Blood pressure 118/78, pulse 85, resp. rate 16, height 5\' 9"  (1.753 m), weight 215 lb (97.5 kg), SpO2 99 %.  Examination: General Appearance: The patient is well-developed, well-nourished, and in no distress. Skin: Gross inspection of skin unremarkable. Head: normocephalic, no gross deformities. Eyes: no gross deformities noted. ENT: ears appear grossly normal no exudates. Neck: Supple. No  thyromegaly. No LAD. Respiratory: clear bilaterally. Cardiovascular: Normal S1 and S2 without murmur or rub. Extremities: No cyanosis. pulses are equal. Neurologic: Alert and oriented. No involuntary movements.  LABS: No results found for this or any previous visit (from the past 2160 hour(s)).  Radiology: Dg Chest 2 View  Result Date: 06/12/2018 CLINICAL DATA:  Cough EXAM: CHEST - 2 VIEW COMPARISON:  06/03/2018 FINDINGS: Patchy lingular opacity may reflect atelectasis scar or minimal infiltrate. This does not appear significantly changed. Normal heart size. No pneumothorax. Surgical changes in the cervical spine. IMPRESSION: Patchy lingular opacity, without significant change may reflect atelectasis, scar, or minimal infiltrate. Electronically Signed   By: Jasmine Pang M.D.   On: 06/12/2018 20:19   Ct Angio Chest Pe W And/or Wo Contrast  Result Date: 06/12/2018 CLINICAL DATA:  Pleuritic chest pain and shortness of breath EXAM: CT ANGIOGRAPHY CHEST WITH CONTRAST TECHNIQUE: Multidetector CT imaging of the chest was performed using the standard protocol during bolus administration of intravenous contrast. Multiplanar CT image reconstructions and MIPs were obtained to evaluate the vascular anatomy. CONTRAST:  17mL OMNIPAQUE IOHEXOL 350 MG/ML SOLN COMPARISON:  None. FINDINGS: Cardiovascular: --Pulmonary arteries: Contrast injection is sufficient to demonstrate satisfactory opacification of the pulmonary arteries to the segmental level. There is no pulmonary embolus. The main pulmonary artery is within normal limits for size. --Aorta: Limited opacification of the aorta due to bolus timing optimization for the pulmonary arteries. Conventional 3 vessel aortic branching pattern. The aortic course and caliber are normal. There is no aortic atherosclerosis. --Heart: Normal size. No pericardial effusion. Mediastinum/Nodes: No mediastinal, hilar or axillary lymphadenopathy. The visualized thyroid and thoracic  esophageal course are unremarkable. Lungs/Pleura: No pulmonary nodules or masses. No pleural effusion or pneumothorax. No focal airspace consolidation. No focal pleural abnormality. Upper Abdomen: Contrast bolus timing is not optimized for evaluation of the abdominal organs. Within this limitation, the visualized organs of the upper abdomen are normal. Musculoskeletal: No chest wall abnormality. No acute or significant osseous findings. Review of the MIP images confirms the above findings. IMPRESSION: No pulmonary embolus or other acute thoracic abnormality. Electronically Signed   By: Deatra Robinson M.D.   On: 06/12/2018 22:34    No results found.  No results found.    Assessment and Plan: Patient Active Problem List   Diagnosis Date Noted  . Acute bronchitis    1. Moderate persistent asthma without complication Stable, continue present management with Dulera.   2. Seasonal allergic rhinitis due to pollen Stable, controlled on current po medication regimen.  We once again, discussed allergy injections and she is ok with po meds at this time, but will consider if her allergies get worse.   3. SOB (shortness of breath) - Spirometry with Graph   General Counseling: I have discussed the findings of the evaluation and examination  with Sydney Hobbs.  I have also discussed any further diagnostic evaluation thatmay be needed or ordered today. Sydney Hobbs verbalizes understanding of the findings of todays visit. We also reviewed her medications today and discussed drug interactions and side effects including but not limited excessive drowsiness and altered mental states. We also discussed that there is always a risk not just to her but also people around her. she has been encouraged to call the office with any questions or concerns that should arise related to todays visit.    Time spent: 15 This patient was seen by Orson Gear AGNP-C in Collaboration with Dr. Devona Konig as a part of collaborative care  agreement.   I have personally obtained a history, examined the patient, evaluated laboratory and imaging results, formulated the assessment and plan and placed orders.    Allyne Gee, MD [facility] Pulmonary and Critical Care Sleep medicine

## 2019-07-17 IMAGING — CT CT ANGIO CHEST
2 of 6 series · 19 of 46 positions shown · IV contrast (omnipaque)
Comparison: None.

CLINICAL DATA: Pleuritic chest pain and shortness of breath

EXAM:
CT ANGIOGRAPHY CHEST WITH CONTRAST
TECHNIQUE: Multidetector CT imaging of the chest was performed using the
standard protocol during bolus administration of intravenous
contrast. Multiplanar CT image reconstructions and MIPs were
obtained to evaluate the vascular anatomy.
CONTRAST:  75mL OMNIPAQUE IOHEXOL 350 MG/ML SOLN

[Series 5: thins · axial · 0.68mm/px · z∈[-674,-385]mm · 16 of 317 slices shown]
[im 14/317  lung]
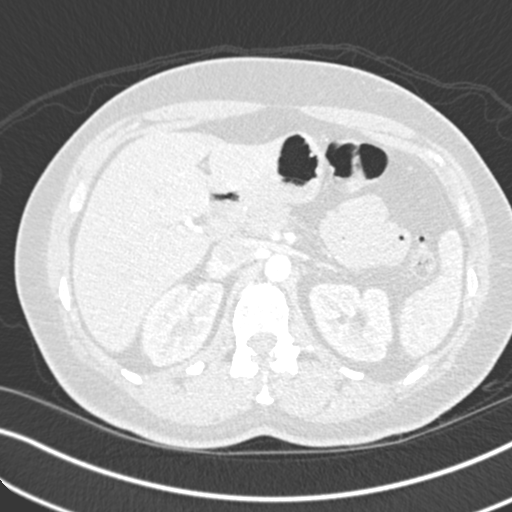
[im 42/317  soft-tissue]
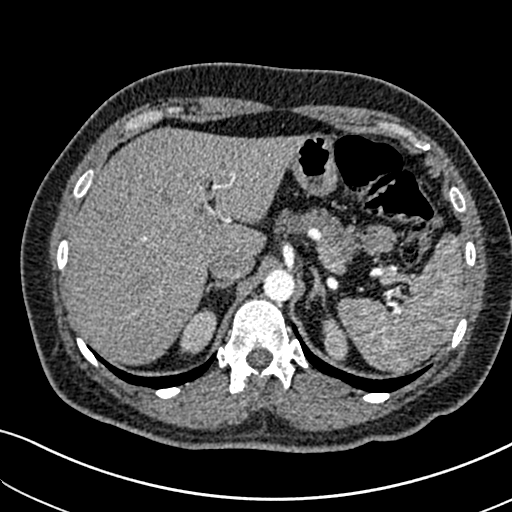
[im 55/317  lung]
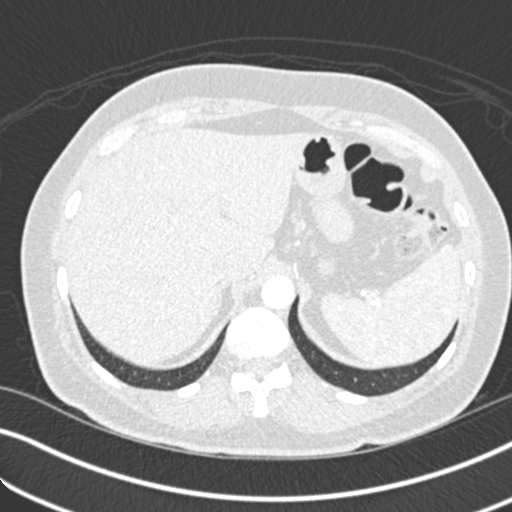
[im 69/317  soft-tissue]
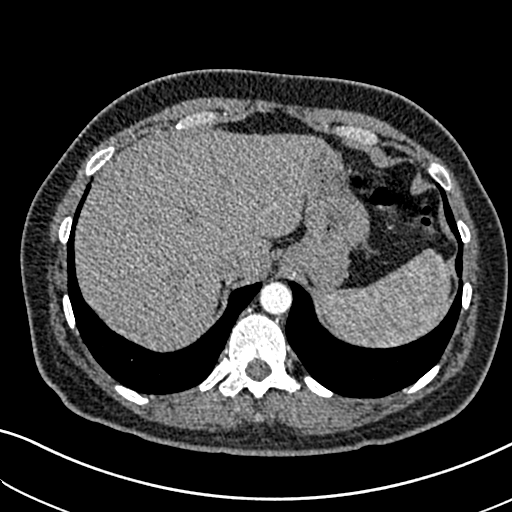
[im 97/317  lung]
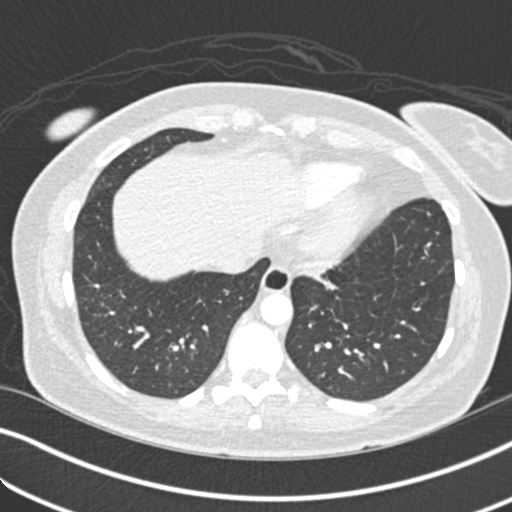
[im 110/317  soft-tissue]
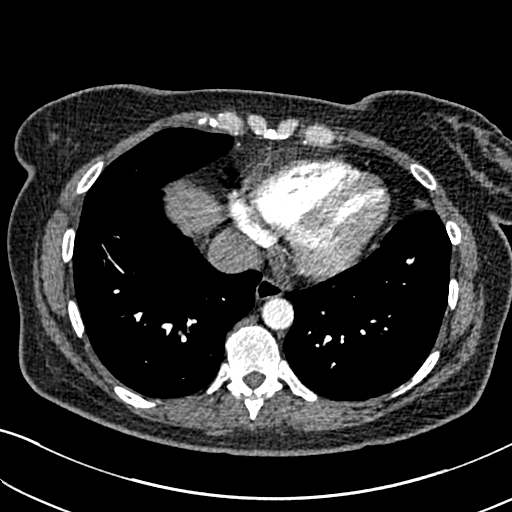
[im 124/317  lung]
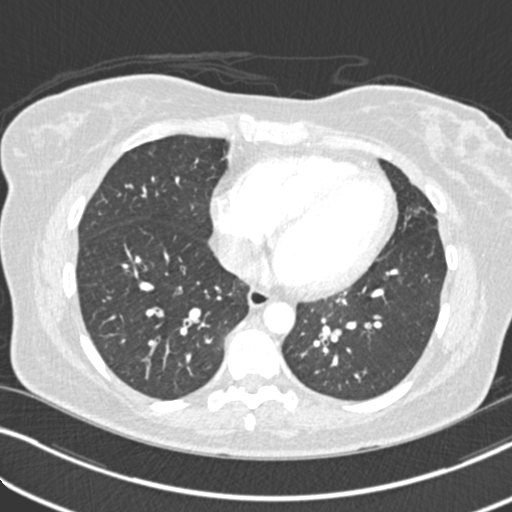
[im 152/317  soft-tissue]
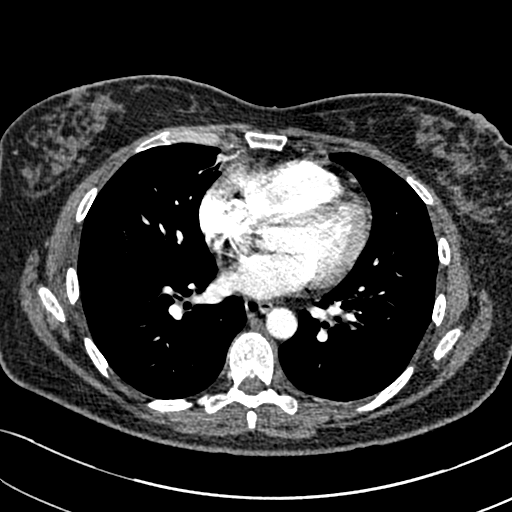
[im 165/317  lung]
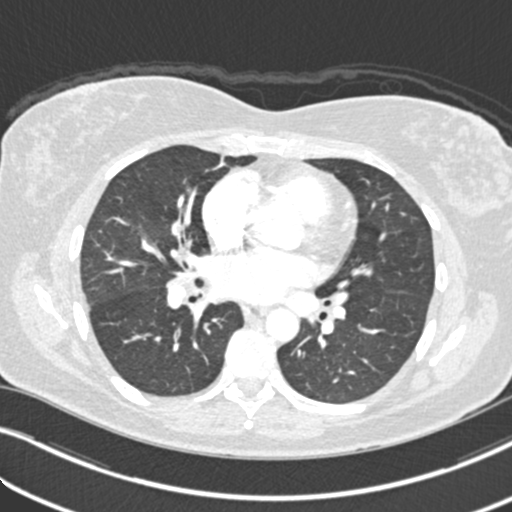
[im 193/317  soft-tissue]
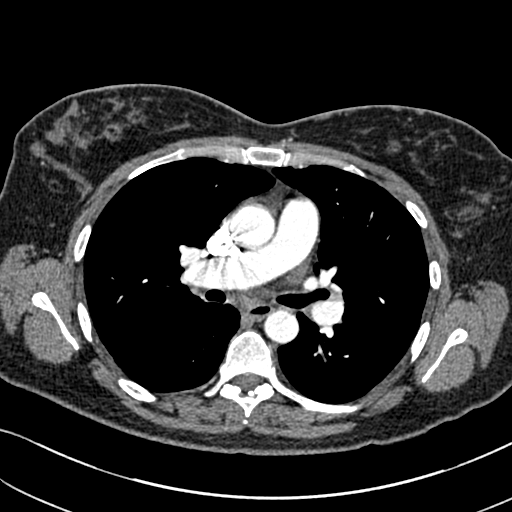
[im 207/317  lung]
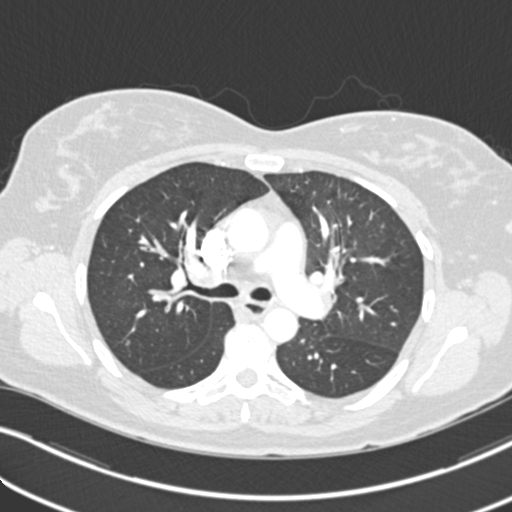
[im 220/317  soft-tissue]
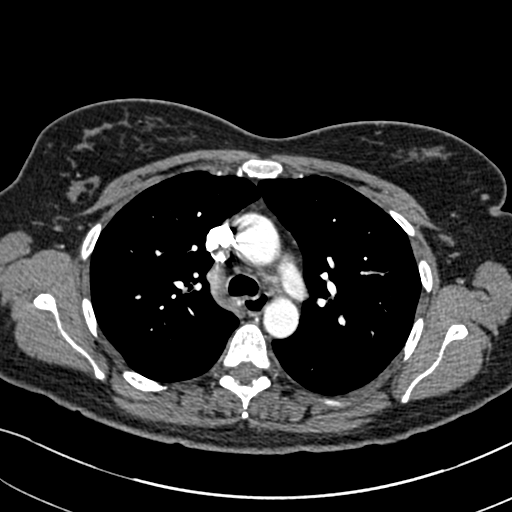
[im 248/317  lung]
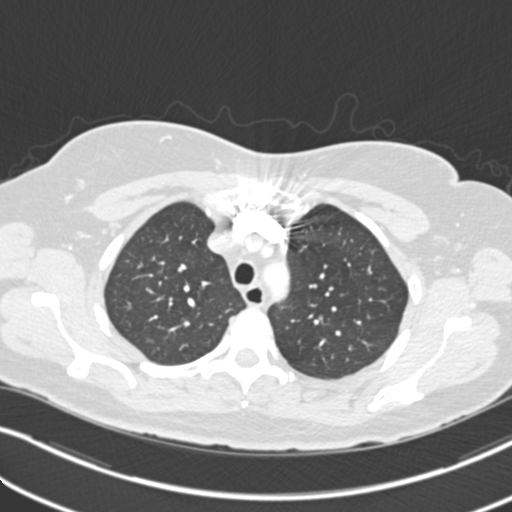
[im 262/317  soft-tissue]
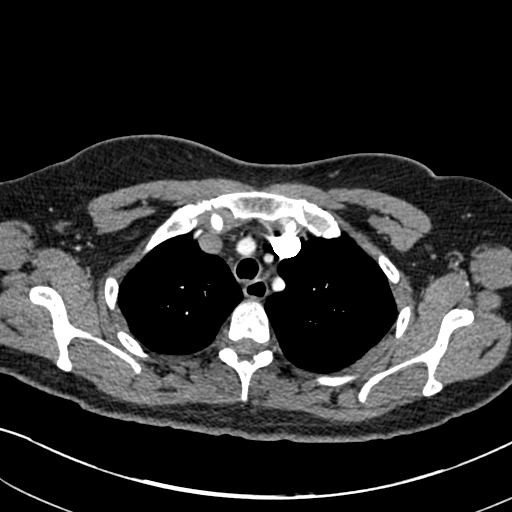
[im 275/317  lung]
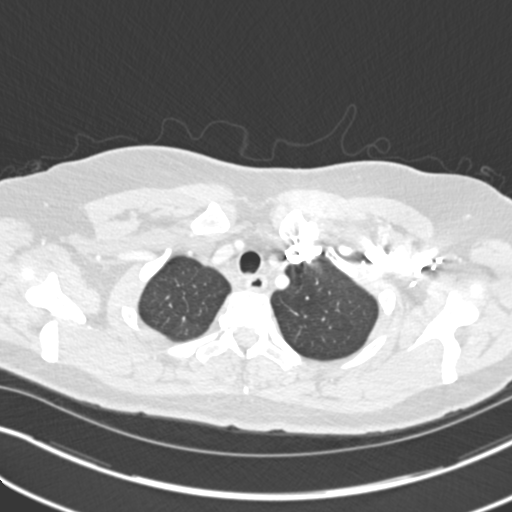
[im 303/317  soft-tissue]
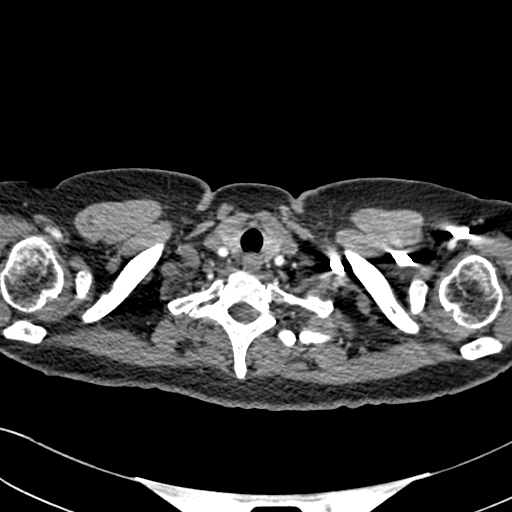

[Series 7: coronal mpr · coronal · 0.70mm/px · 3 of 75 slices shown]
[im 19/75  soft-tissue]
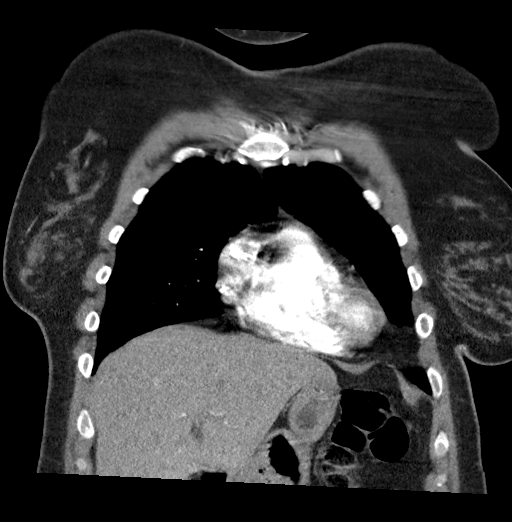
[im 38/75  soft-tissue]
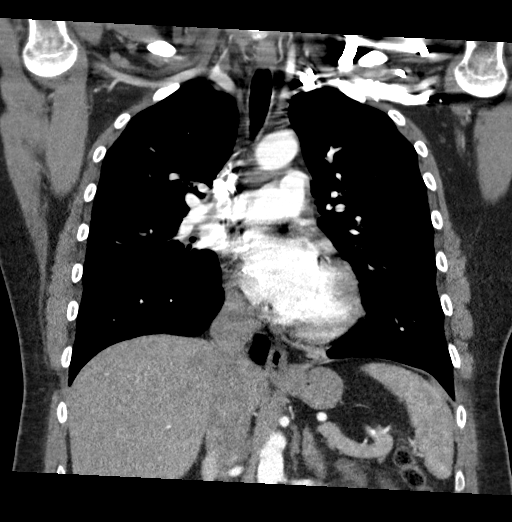
[im 56/75  soft-tissue]
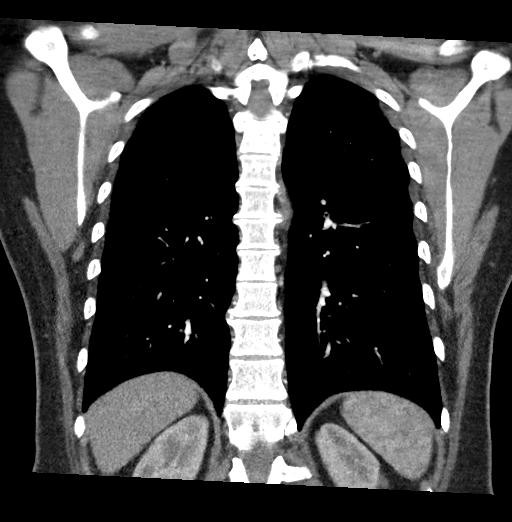

[19 of 46 positions shown; findings below may reference images not displayed]

FINDINGS: Cardiovascular:

--Pulmonary arteries: Contrast injection is sufficient to
demonstrate satisfactory opacification of the pulmonary arteries to
the segmental level. There is no pulmonary embolus. The main
pulmonary artery is within normal limits for size.

--Aorta: Limited opacification of the aorta due to bolus timing
optimization for the pulmonary arteries. Conventional 3 vessel
aortic branching pattern. The aortic course and caliber are normal.
There is no aortic atherosclerosis.

--Heart: Normal size. No pericardial effusion.

Mediastinum/Nodes: No mediastinal, hilar or axillary
lymphadenopathy. The visualized thyroid and thoracic esophageal
course are unremarkable.

Lungs/Pleura: No pulmonary nodules or masses. No pleural effusion or
pneumothorax. No focal airspace consolidation. No focal pleural
abnormality.

Upper Abdomen: Contrast bolus timing is not optimized for evaluation
of the abdominal organs. Within this limitation, the visualized
organs of the upper abdomen are normal.

Musculoskeletal: No chest wall abnormality. No acute or significant
osseous findings.

Review of the MIP images confirms the above findings.
IMPRESSION: No pulmonary embolus or other acute thoracic abnormality.

## 2019-09-16 ENCOUNTER — Other Ambulatory Visit: Payer: Self-pay

## 2019-09-19 ENCOUNTER — Telehealth: Payer: Self-pay

## 2019-09-19 NOTE — Telephone Encounter (Signed)
Called lmom informing patient of appointment on 09/23/2019. klh 

## 2019-09-22 ENCOUNTER — Telehealth: Payer: Self-pay

## 2019-09-22 NOTE — Telephone Encounter (Signed)
Confirmed and screened for 09-23-19 ov. 

## 2019-09-23 ENCOUNTER — Ambulatory Visit: Payer: BC Managed Care – PPO | Admitting: Internal Medicine

## 2019-09-23 ENCOUNTER — Encounter: Payer: Self-pay | Admitting: Internal Medicine

## 2019-09-23 ENCOUNTER — Other Ambulatory Visit: Payer: Self-pay

## 2019-09-23 VITALS — BP 109/67 | HR 94 | Temp 97.8°F | Resp 16 | Ht 69.0 in | Wt 223.0 lb

## 2019-09-23 DIAGNOSIS — J4540 Moderate persistent asthma, uncomplicated: Secondary | ICD-10-CM

## 2019-09-23 DIAGNOSIS — R0602 Shortness of breath: Secondary | ICD-10-CM | POA: Diagnosis not present

## 2019-09-23 MED ORDER — BREO ELLIPTA 100-25 MCG/INH IN AEPB: 1.0000 | INHALATION_SPRAY | Freq: Every day | RESPIRATORY_TRACT | 2 refills | Status: DC

## 2019-09-23 NOTE — Progress Notes (Signed)
[facility] [place], [place] [address]  Pulmonary Sleep Medicine   Office Visit Note  Patient Name: Sydney Hobbs DOB: 09-12-1970 MRN [id number]  Date of Service: 09/23/2019  Complaints/HPI: Pt is here for follow up on asthma and allergies.  Overall she is doing well.  She denies any new or worsening symptoms.  She is currently using Dulera samples.  Her insurance prefers Breo, Advair or Symbicort.     ROS  General: (-) fever, (-) chills, (-) night sweats, (-) weakness Skin: (-) rashes, (-) itching,. Eyes: (-) visual changes, (-) redness, (-) itching. Nose and Sinuses: (-) nasal stuffiness or itchiness, (-) postnasal drip, (-) nosebleeds, (-) sinus trouble. Mouth and Throat: (-) sore throat, (-) hoarseness. Neck: (-) swollen glands, (-) enlarged thyroid, (-) neck pain. Respiratory: - cough, (-) bloody sputum, - shortness of breath, - wheezing. Cardiovascular: - ankle swelling, (-) chest pain. Lymphatic: (-) lymph node enlargement. Neurologic: (-) numbness, (-) tingling. Psychiatric: (-) anxiety, (-) depression   Current Medication: Outpatient Encounter Medications as of 09/23/2019  Medication Sig  . albuterol (PROVENTIL HFA;VENTOLIN HFA) 108 (90 Base) MCG/ACT inhaler Inhale 2 puffs into the lungs every 6 (six) hours as needed for wheezing or shortness of breath.  . desloratadine (CLARINEX) 5 MG tablet Take 1 tablet (5 mg total) by mouth daily.  . fluticasone (FLONASE) 50 MCG/ACT nasal spray Place 2 sprays into both nostrils daily.  . mometasone-formoterol (DULERA) 100-5 MCG/ACT AERO Inhale 1 puff into the lungs 2 (two) times daily.  . montelukast (SINGULAIR) 10 MG tablet Take 1 tablet (10 mg total) by mouth at bedtime.  . fluticasone furoate-vilanterol (BREO ELLIPTA) 100-25 MCG/INH AEPB Inhale 1 puff into the lungs daily.   No facility-administered encounter medications on file as of 09/23/2019.    Surgical History: Past Surgical History:  Procedure  Laterality Date  . CERVICAL DISCECTOMY      Medical History: Past Medical History:  Diagnosis Date  . Acute bronchitis     Family History: History reviewed. No pertinent family history.  Social History: Social History   Socioeconomic History  . Marital status: Married    Spouse name: Not on file  . Number of children: Not on file  . Years of education: Not on file  . Highest education level: Not on file  Occupational History  . Not on file  Tobacco Use  . Smoking status: Former Research scientist (life sciences)  . Smokeless tobacco: Never Used  Substance and Sexual Activity  . Alcohol use: Yes    Comment: occ  . Drug use: Never  . Sexual activity: Not on file  Other Topics Concern  . Not on file  Social History Narrative  . Not on file   Social Determinants of Health   Financial Resource Strain:   . Difficulty of Paying Living Expenses:   Food Insecurity:   . Worried About Charity fundraiser in the Last Year:   . Arboriculturist in the Last Year:   Transportation Needs:   . Film/video editor (Medical):   Marland Kitchen Lack of Transportation (Non-Medical):   Physical Activity:   . Days of Exercise per Week:   . Minutes of Exercise per Session:   Stress:   . Feeling of Stress :   Social Connections:   . Frequency of Communication with Friends and Family:   . Frequency of Social Gatherings with Friends and Family:   . Attends Religious Services:   . Active Member of Clubs or  Organizations:   . Attends Banker Meetings:   Marland Kitchen Marital Status:   Intimate Partner Violence:   . Fear of Current or Ex-Partner:   . Emotionally Abused:   Marland Kitchen Physically Abused:   . Sexually Abused:     Vital Signs: Blood pressure 109/67, pulse 94, temperature 97.8 F (36.6 C), resp. rate 16, height 5\' 9"  (1.753 m), weight 223 lb (101.2 kg), SpO2 99 %.  Examination: General Appearance: The patient is well-developed, well-nourished, and in no distress. Skin: Gross inspection of skin  unremarkable. Head: normocephalic, no gross deformities. Eyes: no gross deformities noted. ENT: ears appear grossly normal no exudates. Neck: Supple. No thyromegaly. No LAD. Respiratory: clear bilaterally. Cardiovascular: Normal S1 and S2 without murmur or rub. Extremities: No cyanosis. pulses are equal. Neurologic: Alert and oriented. No involuntary movements.  LABS: No results found for this or any previous visit (from the past 2160 hour(s)).  Radiology: DG Chest 2 View  Result Date: 06/12/2018 CLINICAL DATA:  Cough EXAM: CHEST - 2 VIEW COMPARISON:  06/03/2018 FINDINGS: Patchy lingular opacity may reflect atelectasis scar or minimal infiltrate. This does not appear significantly changed. Normal heart size. No pneumothorax. Surgical changes in the cervical spine. IMPRESSION: Patchy lingular opacity, without significant change may reflect atelectasis, scar, or minimal infiltrate. Electronically Signed   By: 14/08/2017 M.D.   On: 06/12/2018 20:19   CT Angio Chest PE W and/or Wo Contrast  Result Date: 06/12/2018 CLINICAL DATA:  Pleuritic chest pain and shortness of breath EXAM: CT ANGIOGRAPHY CHEST WITH CONTRAST TECHNIQUE: Multidetector CT imaging of the chest was performed using the standard protocol during bolus administration of intravenous contrast. Multiplanar CT image reconstructions and MIPs were obtained to evaluate the vascular anatomy. CONTRAST:  25mL OMNIPAQUE IOHEXOL 350 MG/ML SOLN COMPARISON:  None. FINDINGS: Cardiovascular: --Pulmonary arteries: Contrast injection is sufficient to demonstrate satisfactory opacification of the pulmonary arteries to the segmental level. There is no pulmonary embolus. The main pulmonary artery is within normal limits for size. --Aorta: Limited opacification of the aorta due to bolus timing optimization for the pulmonary arteries. Conventional 3 vessel aortic branching pattern. The aortic course and caliber are normal. There is no aortic  atherosclerosis. --Heart: Normal size. No pericardial effusion. Mediastinum/Nodes: No mediastinal, hilar or axillary lymphadenopathy. The visualized thyroid and thoracic esophageal course are unremarkable. Lungs/Pleura: No pulmonary nodules or masses. No pleural effusion or pneumothorax. No focal airspace consolidation. No focal pleural abnormality. Upper Abdomen: Contrast bolus timing is not optimized for evaluation of the abdominal organs. Within this limitation, the visualized organs of the upper abdomen are normal. Musculoskeletal: No chest wall abnormality. No acute or significant osseous findings. Review of the MIP images confirms the above findings. IMPRESSION: No pulmonary embolus or other acute thoracic abnormality. Electronically Signed   By: 72m M.D.   On: 06/12/2018 22:34    No results found.  No results found.    Assessment and Plan: Patient Active Problem List   Diagnosis Date Noted  . Acute bronchitis     1. Moderate persistent asthma without complication Try breo, and follow up as discussed.  - fluticasone furoate-vilanterol (BREO ELLIPTA) 100-25 MCG/INH AEPB; Inhale 1 puff into the lungs daily.  Dispense: 60 each; Refill: 2  2. SOB (shortness of breath) - Spirometry with Graph  General Counseling: I have discussed the findings of the evaluation and examination with 14/05/2018.  I have also discussed any further diagnostic evaluation thatmay be needed or ordered today. Arraya verbalizes understanding of  the findings of todays visit. We also reviewed her medications today and discussed drug interactions and side effects including but not limited excessive drowsiness and altered mental states. We also discussed that there is always a risk not just to her but also people around her. she has been encouraged to call the office with any questions or concerns that should arise related to todays visit.  Orders Placed This Encounter  Procedures  . Spirometry with Graph    Order  Specific Question:   Where should this test be performed?    Answer:   [facility]     Time spent: 25 This patient was seen by Blima Ledger AGNP-C in Collaboration with Dr. Freda Munro as a part of collaborative care agreement.   I have personally obtained a history, examined the patient, evaluated laboratory and imaging results, formulated the assessment and plan and placed orders.    Yevonne Pax, MD [facility] Pulmonary and Critical Care Sleep medicine

## 2019-12-31 ENCOUNTER — Other Ambulatory Visit: Payer: Self-pay | Admitting: Adult Health

## 2019-12-31 DIAGNOSIS — J4540 Moderate persistent asthma, uncomplicated: Secondary | ICD-10-CM

## 2020-03-19 ENCOUNTER — Telehealth: Payer: Self-pay

## 2020-03-19 NOTE — Telephone Encounter (Signed)
LMOM for OV on 9/21 

## 2020-03-19 NOTE — Telephone Encounter (Signed)
Confirmed and screened for 03-23-20 ov. 

## 2020-03-23 ENCOUNTER — Encounter: Payer: Self-pay | Admitting: Internal Medicine

## 2020-03-23 ENCOUNTER — Other Ambulatory Visit: Payer: Self-pay

## 2020-03-23 ENCOUNTER — Ambulatory Visit: Payer: BC Managed Care – PPO | Admitting: Internal Medicine

## 2020-03-23 DIAGNOSIS — J4540 Moderate persistent asthma, uncomplicated: Secondary | ICD-10-CM | POA: Diagnosis not present

## 2020-03-23 DIAGNOSIS — R0602 Shortness of breath: Secondary | ICD-10-CM | POA: Diagnosis not present

## 2020-03-23 DIAGNOSIS — J301 Allergic rhinitis due to pollen: Secondary | ICD-10-CM

## 2020-03-23 MED ORDER — MONTELUKAST SODIUM 10 MG PO TABS: 10.0000 mg | ORAL_TABLET | Freq: Every day | ORAL | 5 refills | Status: DC

## 2020-03-23 MED ORDER — BREO ELLIPTA 100-25 MCG/INH IN AEPB: 1.0000 | INHALATION_SPRAY | Freq: Every day | RESPIRATORY_TRACT | 2 refills | Status: DC

## 2020-03-23 MED ORDER — DESLORATADINE 5 MG PO TABS: 5.0000 mg | ORAL_TABLET | Freq: Every day | ORAL | 5 refills | Status: DC

## 2020-03-23 NOTE — Progress Notes (Signed)
[facility] [address] [place], [place] [address]  Pulmonary Sleep Medicine   Office Visit Note  Patient Name: Sydney Hobbs DOB: 03/25/1971 MRN [id number]  Date of Service: 03/23/2020  Complaints/HPI: Patient is here for routine pulmonary follow-up. She says her breathing is good, at her baseline. She has been using Breo and feels that this is controlling her symptoms, no negative effects. She rarely feels the need to use her rescue inhaler. Needs an updated PFT.  ROS  General: (-) fever, (-) chills, (-) night sweats, (-) weakness Skin: (-) rashes, (-) itching,. Eyes: (-) visual changes, (-) redness, (-) itching. Nose and Sinuses: (-) nasal stuffiness or itchiness, (-) postnasal drip, (-) nosebleeds, (-) sinus trouble. Mouth and Throat: (-) sore throat, (-) hoarseness. Neck: (-) swollen glands, (-) enlarged thyroid, (-) neck pain. Respiratory: - cough, (-) bloody sputum, - shortness of breath, - wheezing. Cardiovascular: - ankle swelling, (-) chest pain. Lymphatic: (-) lymph node enlargement. Neurologic: (-) numbness, (-) tingling. Psychiatric: (-) anxiety, (-) depression   Current Medication: Outpatient Encounter Medications as of 03/23/2020  Medication Sig  . albuterol (PROVENTIL HFA;VENTOLIN HFA) 108 (90 Base) MCG/ACT inhaler Inhale 2 puffs into the lungs every 6 (six) hours as needed for wheezing or shortness of breath.  . desloratadine (CLARINEX) 5 MG tablet TAKE 1 TABLET BY MOUTH EVERY DAY  . fluticasone (FLONASE) 50 MCG/ACT nasal spray Place 2 sprays into both nostrils daily.  . fluticasone furoate-vilanterol (BREO ELLIPTA) 100-25 MCG/INH AEPB Inhale 1 puff into the lungs daily.  . montelukast (SINGULAIR) 10 MG tablet Take 1 tablet (10 mg total) by mouth at bedtime.  . [DISCONTINUED] mometasone-formoterol (DULERA) 100-5 MCG/ACT AERO Inhale 1 puff into the lungs 2 (two) times daily.   No facility-administered encounter medications on file as of 03/23/2020.     Surgical History: Past Surgical History:  Procedure Laterality Date  . CERVICAL DISCECTOMY      Medical History: Past Medical History:  Diagnosis Date  . Acute bronchitis     Family History: History reviewed. No pertinent family history.  Social History: Social History   Socioeconomic History  . Marital status: Married    Spouse name: Not on file  . Number of children: Not on file  . Years of education: Not on file  . Highest education level: Not on file  Occupational History  . Not on file  Tobacco Use  . Smoking status: Former Games developer  . Smokeless tobacco: Never Used  Substance and Sexual Activity  . Alcohol use: Yes    Comment: occ  . Drug use: Never  . Sexual activity: Not on file  Other Topics Concern  . Not on file  Social History Narrative  . Not on file   Social Determinants of Health   Financial Resource Strain:   . Difficulty of Paying Living Expenses: Not on file  Food Insecurity:   . Worried About Programme researcher, broadcasting/film/video in the Last Year: Not on file  . Ran Out of Food in the Last Year: Not on file  Transportation Needs:   . Lack of Transportation (Medical): Not on file  . Lack of Transportation (Non-Medical): Not on file  Physical Activity:   . Days of Exercise per Week: Not on file  . Minutes of Exercise per Session: Not on file  Stress:   . Feeling of Stress : Not on file  Social Connections:   . Frequency of Communication with Friends and Family: Not on file  . Frequency  of Social Gatherings with Friends and Family: Not on file  . Attends Religious Services: Not on file  . Active Member of Clubs or Organizations: Not on file  . Attends Banker Meetings: Not on file  . Marital Status: Not on file  Intimate Partner Violence:   . Fear of Current or Ex-Partner: Not on file  . Emotionally Abused: Not on file  . Physically Abused: Not on file  . Sexually Abused: Not on file    Vital Signs: Blood pressure 131/88, pulse 98,  temperature 97.8 F (36.6 C), resp. rate 16, height 5\' 9"  (1.753 m), weight 226 lb (102.5 kg), SpO2 98 %.  Examination: General Appearance: The patient is well-developed, well-nourished, and in no distress. Skin: Gross inspection of skin unremarkable. Head: normocephalic, no gross deformities. Eyes: no gross deformities noted. ENT: ears appear grossly normal no exudates. Neck: Supple. No thyromegaly. No LAD. Respiratory: Clear throughout, no wheezing or rhonchi noted.. Cardiovascular: Normal S1 and S2 without murmur or rub. Extremities: No cyanosis. pulses are equal. Neurologic: Alert and oriented. No involuntary movements.  LABS: No results found for this or any previous visit (from the past 2160 hour(s)).  Radiology: DG Chest 2 View  Result Date: 06/12/2018 CLINICAL DATA:  Cough EXAM: CHEST - 2 VIEW COMPARISON:  06/03/2018 FINDINGS: Patchy lingular opacity may reflect atelectasis scar or minimal infiltrate. This does not appear significantly changed. Normal heart size. No pneumothorax. Surgical changes in the cervical spine. IMPRESSION: Patchy lingular opacity, without significant change may reflect atelectasis, scar, or minimal infiltrate. Electronically Signed   By: 14/08/2017 M.D.   On: 06/12/2018 20:19   CT Angio Chest PE W and/or Wo Contrast  Result Date: 06/12/2018 CLINICAL DATA:  Pleuritic chest pain and shortness of breath EXAM: CT ANGIOGRAPHY CHEST WITH CONTRAST TECHNIQUE: Multidetector CT imaging of the chest was performed using the standard protocol during bolus administration of intravenous contrast. Multiplanar CT image reconstructions and MIPs were obtained to evaluate the vascular anatomy. CONTRAST:  12mL OMNIPAQUE IOHEXOL 350 MG/ML SOLN COMPARISON:  None. FINDINGS: Cardiovascular: --Pulmonary arteries: Contrast injection is sufficient to demonstrate satisfactory opacification of the pulmonary arteries to the segmental level. There is no pulmonary embolus. The main  pulmonary artery is within normal limits for size. --Aorta: Limited opacification of the aorta due to bolus timing optimization for the pulmonary arteries. Conventional 3 vessel aortic branching pattern. The aortic course and caliber are normal. There is no aortic atherosclerosis. --Heart: Normal size. No pericardial effusion. Mediastinum/Nodes: No mediastinal, hilar or axillary lymphadenopathy. The visualized thyroid and thoracic esophageal course are unremarkable. Lungs/Pleura: No pulmonary nodules or masses. No pleural effusion or pneumothorax. No focal airspace consolidation. No focal pleural abnormality. Upper Abdomen: Contrast bolus timing is not optimized for evaluation of the abdominal organs. Within this limitation, the visualized organs of the upper abdomen are normal. Musculoskeletal: No chest wall abnormality. No acute or significant osseous findings. Review of the MIP images confirms the above findings. IMPRESSION: No pulmonary embolus or other acute thoracic abnormality. Electronically Signed   By: 73m M.D.   On: 06/12/2018 22:34    No results found.  No results found.    Assessment and Plan: Patient Active Problem List   Diagnosis Date Noted  . Acute bronchitis     1. Moderate persistent asthma without complication Asthma well controlled at this time, will continue with current therapy. Will obtain updated PFT to assess for progression. - fluticasone furoate-vilanterol (BREO ELLIPTA) 100-25 MCG/INH AEPB; Inhale 1  puff into the lungs daily.  Dispense: 60 each; Refill: 2 - desloratadine (CLARINEX) 5 MG tablet; Take 1 tablet (5 mg total) by mouth daily.  Dispense: 30 tablet; Refill: 5 - montelukast (SINGULAIR) 10 MG tablet; Take 1 tablet (10 mg total) by mouth at bedtime.  Dispense: 30 tablet; Refill: 5 - Pulmonary function test; Future  2. Seasonal allergic rhinitis due to pollen Symptoms are well controlled at this time, will continue routine monitoring.  3. SOB  (shortness of breath) Spirometry slightly decreased overall, will obtain PFT to further assess. - Spirometry with Graph  General Counseling: I have discussed the findings of the evaluation and examination with Lawson Fiscal.  I have also discussed any further diagnostic evaluation thatmay be needed or ordered today. Donnika verbalizes understanding of the findings of todays visit. We also reviewed her medications today and discussed drug interactions and side effects including but not limited excessive drowsiness and altered mental states. We also discussed that there is always a risk not just to her but also people around her. she has been encouraged to call the office with any questions or concerns that should arise related to todays visit.  Orders Placed This Encounter  Procedures  . Spirometry with Graph    Order Specific Question:   Where should this test be performed?    Answer:   [facility]     Time spent: 46  I have personally obtained a history, examined the patient, evaluated laboratory and imaging results, formulated the assessment and plan and placed orders. This patient was seen by Brent General AGNP-C in Collaboration with Dr. Freda Munro as a part of collaborative care agreement.    Yevonne Pax, MD [facility] Pulmonary and Critical Care Sleep medicine

## 2020-03-25 ENCOUNTER — Encounter: Payer: Self-pay | Admitting: Internal Medicine

## 2020-03-25 NOTE — Patient Instructions (Signed)
Asthma, Adult  Asthma is a long-term (chronic) condition in which the airways get tight and narrow. The airways are the breathing passages that lead from the nose and mouth down into the lungs. A person with asthma will have times when symptoms get worse. These are called asthma attacks. They can cause coughing, whistling sounds when you breathe (wheezing), shortness of breath, and chest pain. They can make it hard to breathe. There is no cure for asthma, but medicines and lifestyle changes can help control it. There are many things that can bring on an asthma attack or make asthma symptoms worse (triggers). Common triggers include:  Mold.  Dust.  Cigarette smoke.  Cockroaches.  Things that can cause allergy symptoms (allergens). These include animal skin flakes (dander) and pollen from trees or grass.  Things that pollute the air. These may include household cleaners, wood smoke, smog, or chemical odors.  Cold air, weather changes, and wind.  Crying or laughing hard.  Stress.  Certain medicines or drugs.  Certain foods such as dried fruit, potato chips, and grape juice.  Infections, such as a cold or the flu.  Certain medical conditions or diseases.  Exercise or tiring activities. Asthma may be treated with medicines and by staying away from the things that cause asthma attacks. Types of medicines may include:  Controller medicines. These help prevent asthma symptoms. They are usually taken every day.  Fast-acting reliever or rescue medicines. These quickly relieve asthma symptoms. They are used as needed and provide short-term relief.  Allergy medicines if your attacks are brought on by allergens.  Medicines to help control the body's defense (immune) system. Follow these instructions at home: Avoiding triggers in your home  Change your heating and air conditioning filter often.  Limit your use of fireplaces and wood stoves.  Get rid of pests (such as roaches and  mice) and their droppings.  Throw away plants if you see mold on them.  Clean your floors. Dust regularly. Use cleaning products that do not smell.  Have someone vacuum when you are not home. Use a vacuum cleaner with a HEPA filter if possible.  Replace carpet with wood, tile, or vinyl flooring. Carpet can trap animal skin flakes and dust.  Use allergy-proof pillows, mattress covers, and box spring covers.  Wash bed sheets and blankets every week in hot water. Dry them in a dryer.  Keep your bedroom free of any triggers.  Avoid pets and keep windows closed when things that cause allergy symptoms are in the air.  Use blankets that are made of polyester or cotton.  Clean bathrooms and kitchens with bleach. If possible, have someone repaint the walls in these rooms with mold-resistant paint. Keep out of the rooms that are being cleaned and painted.  Wash your hands often with soap and water. If soap and water are not available, use hand sanitizer.  Do not allow anyone to smoke in your home. General instructions  Take over-the-counter and prescription medicines only as told by your doctor. ? Talk with your doctor if you have questions about how or when to take your medicines. ? Make note if you need to use your medicines more often than usual.  Do not use any products that contain nicotine or tobacco, such as cigarettes and e-cigarettes. If you need help quitting, ask your doctor.  Stay away from secondhand smoke.  Avoid doing things outdoors when allergen counts are high and when air quality is low.  Wear a ski mask   when doing outdoor activities in the winter. The mask should cover your nose and mouth. Exercise indoors on cold days if you can.  Warm up before you exercise. Take time to cool down after exercise.  Use a peak flow meter as told by your doctor. A peak flow meter is a tool that measures how well the lungs are working.  Keep track of the peak flow meter's readings.  Write them down.  Follow your asthma action plan. This is a written plan for taking care of your asthma and treating your attacks.  Make sure you get all the shots (vaccines) that your doctor recommends. Ask your doctor about a flu shot and a pneumonia shot.  Keep all follow-up visits as told by your doctor. This is important. Contact a doctor if:  You have wheezing, shortness of breath, or a cough even while taking medicine to prevent attacks.  The mucus you cough up (sputum) is thicker than usual.  The mucus you cough up changes from clear or white to yellow, green, gray, or bloody.  You have problems from the medicine you are taking, such as: ? A rash. ? Itching. ? Swelling. ? Trouble breathing.  You need reliever medicines more than 2-3 times a week.  Your peak flow reading is still at 50-79% of your personal best after following the action plan for 1 hour.  You have a fever. Get help right away if:  You seem to be worse and are not responding to medicine during an asthma attack.  You are short of breath even at rest.  You get short of breath when doing very little activity.  You have trouble eating, drinking, or talking.  You have chest pain or tightness.  You have a fast heartbeat.  Your lips or fingernails start to turn blue.  You are light-headed or dizzy, or you faint.  Your peak flow is less than 50% of your personal best.  You feel too tired to breathe normally. Summary  Asthma is a long-term (chronic) condition in which the airways get tight and narrow. An asthma attack can make it hard to breathe.  Asthma cannot be cured, but medicines and lifestyle changes can help control it.  Make sure you understand how to avoid triggers and how and when to use your medicines. This information is not intended to replace advice given to you by your health care provider. Make sure you discuss any questions you have with your health care provider. Document Revised:  08/22/2018 Document Reviewed: 07/24/2016 Elsevier Patient Education  2020 [employer]  

## 2020-07-12 ENCOUNTER — Telehealth: Payer: Self-pay

## 2020-07-12 NOTE — Telephone Encounter (Signed)
no vm on either # in chart unable to confirm pft for 07-14-20. Toni Amend

## 2020-07-14 ENCOUNTER — Other Ambulatory Visit: Payer: Self-pay

## 2020-07-14 ENCOUNTER — Ambulatory Visit: Payer: BC Managed Care – PPO | Admitting: Internal Medicine

## 2020-07-14 DIAGNOSIS — J4540 Moderate persistent asthma, uncomplicated: Secondary | ICD-10-CM

## 2020-09-21 ENCOUNTER — Other Ambulatory Visit: Payer: Self-pay

## 2020-09-21 ENCOUNTER — Encounter: Payer: Self-pay | Admitting: Internal Medicine

## 2020-09-21 ENCOUNTER — Ambulatory Visit: Payer: BC Managed Care – PPO | Admitting: Internal Medicine

## 2020-09-21 VITALS — BP 120/72 | HR 99 | Temp 98.4°F | Resp 16 | Ht 69.0 in | Wt 219.6 lb

## 2020-09-21 DIAGNOSIS — R0602 Shortness of breath: Secondary | ICD-10-CM | POA: Diagnosis not present

## 2020-09-21 DIAGNOSIS — J4540 Moderate persistent asthma, uncomplicated: Secondary | ICD-10-CM

## 2020-09-21 DIAGNOSIS — J301 Allergic rhinitis due to pollen: Secondary | ICD-10-CM

## 2020-09-21 MED ORDER — FLUTICASONE PROPIONATE 50 MCG/ACT NA SUSP: 2.0000 | Freq: Every day | NASAL | 5 refills | Status: DC

## 2020-09-21 MED ORDER — MONTELUKAST SODIUM 10 MG PO TABS: 10.0000 mg | ORAL_TABLET | Freq: Every day | ORAL | 3 refills | Status: DC

## 2020-09-21 MED ORDER — DESLORATADINE 5 MG PO TABS: 5.0000 mg | ORAL_TABLET | Freq: Every day | ORAL | 5 refills | Status: DC

## 2020-09-21 MED ORDER — BREO ELLIPTA 100-25 MCG/INH IN AEPB: 1.0000 | INHALATION_SPRAY | Freq: Every day | RESPIRATORY_TRACT | 5 refills | Status: DC

## 2020-09-21 MED ORDER — ALBUTEROL SULFATE HFA 108 (90 BASE) MCG/ACT IN AERS: 2.0000 | INHALATION_SPRAY | Freq: Four times a day (QID) | RESPIRATORY_TRACT | 0 refills | Status: DC | PRN

## 2020-09-21 NOTE — Patient Instructions (Addendum)
Asthma, Adult  Asthma is a long-term (chronic) condition in which the airways get tight and narrow. The airways are the breathing passages that lead from the nose and mouth down into the lungs. A person with asthma will have times when symptoms get worse. These are called asthma attacks. They can cause coughing, whistling sounds when you breathe (wheezing), shortness of breath, and chest pain. They can make it hard to breathe. There is no cure for asthma, but medicines and lifestyle changes can help control it. There are many things that can bring on an asthma attack or make asthma symptoms worse (triggers). Common triggers include:  Mold.  Dust.  Cigarette smoke.  Cockroaches.  Things that can cause allergy symptoms (allergens). These include animal skin flakes (dander) and pollen from trees or grass.  Things that pollute the air. These may include household cleaners, wood smoke, smog, or chemical odors.  Cold air, weather changes, and wind.  Crying or laughing hard.  Stress.  Certain medicines or drugs.  Certain foods such as dried fruit, potato chips, and grape juice.  Infections, such as a cold or the flu.  Certain medical conditions or diseases.  Exercise or tiring activities. Asthma may be treated with medicines and by staying away from the things that cause asthma attacks. Types of medicines may include:  Controller medicines. These help prevent asthma symptoms. They are usually taken every day.  Fast-acting reliever or rescue medicines. These quickly relieve asthma symptoms. They are used as needed and provide short-term relief.  Allergy medicines if your attacks are brought on by allergens.  Medicines to help control the body's defense (immune) system. Follow these instructions at home: Avoiding triggers in your home  Change your heating and air conditioning filter often.  Limit your use of fireplaces and wood stoves.  Get rid of pests (such as roaches and  mice) and their droppings.  Throw away plants if you see mold on them.  Clean your floors. Dust regularly. Use cleaning products that do not smell.  Have someone vacuum when you are not home. Use a vacuum cleaner with a HEPA filter if possible.  Replace carpet with wood, tile, or vinyl flooring. Carpet can trap animal skin flakes and dust.  Use allergy-proof pillows, mattress covers, and box spring covers.  Wash bed sheets and blankets every week in hot water. Dry them in a dryer.  Keep your bedroom free of any triggers.  Avoid pets and keep windows closed when things that cause allergy symptoms are in the air.  Use blankets that are made of polyester or cotton.  Clean bathrooms and kitchens with bleach. If possible, have someone repaint the walls in these rooms with mold-resistant paint. Keep out of the rooms that are being cleaned and painted.  Wash your hands often with soap and water. If soap and water are not available, use hand sanitizer.  Do not allow anyone to smoke in your home. General instructions  Take over-the-counter and prescription medicines only as told by your doctor. ? Talk with your doctor if you have questions about how or when to take your medicines. ? Make note if you need to use your medicines more often than usual.  Do not use any products that contain nicotine or tobacco, such as cigarettes and e-cigarettes. If you need help quitting, ask your doctor.  Stay away from secondhand smoke.  Avoid doing things outdoors when allergen counts are high and when air quality is low.  Wear a ski mask   when doing outdoor activities in the winter. The mask should cover your nose and mouth. Exercise indoors on cold days if you can.  Warm up before you exercise. Take time to cool down after exercise.  Use a peak flow meter as told by your doctor. A peak flow meter is a tool that measures how well the lungs are working.  Keep track of the peak flow meter's readings.  Write them down.  Follow your asthma action plan. This is a written plan for taking care of your asthma and treating your attacks.  Make sure you get all the shots (vaccines) that your doctor recommends. Ask your doctor about a flu shot and a pneumonia shot.  Keep all follow-up visits as told by your doctor. This is important. Contact a doctor if:  You have wheezing, shortness of breath, or a cough even while taking medicine to prevent attacks.  The mucus you cough up (sputum) is thicker than usual.  The mucus you cough up changes from clear or white to yellow, green, gray, or bloody.  You have problems from the medicine you are taking, such as: ? A rash. ? Itching. ? Swelling. ? Trouble breathing.  You need reliever medicines more than 2-3 times a week.  Your peak flow reading is still at 50-79% of your personal best after following the action plan for 1 hour.  You have a fever. Get help right away if:  You seem to be worse and are not responding to medicine during an asthma attack.  You are short of breath even at rest.  You get short of breath when doing very little activity.  You have trouble eating, drinking, or talking.  You have chest pain or tightness.  You have a fast heartbeat.  Your lips or fingernails start to turn blue.  You are light-headed or dizzy, or you faint.  Your peak flow is less than 50% of your personal best.  You feel too tired to breathe normally. Summary  Asthma is a long-term (chronic) condition in which the airways get tight and narrow. An asthma attack can make it hard to breathe.  Asthma cannot be cured, but medicines and lifestyle changes can help control it.  Make sure you understand how to avoid triggers and how and when to use your medicines. This information is not intended to replace advice given to you by your health care provider. Make sure you discuss any questions you have with your health care provider. Document Revised:  10/22/2019 Document Reviewed: 10/22/2019 Elsevier Patient Education  2021 [employer]  Asthma, Adult  Asthma is a long-term (chronic) condition in which the airways get tight and narrow. The airways are the breathing passages that lead from the nose and mouth down into the lungs. A person with asthma will have times when symptoms get worse. These are called asthma attacks. They can cause coughing, whistling sounds when you breathe (wheezing), shortness of breath, and chest pain. They can make it hard to breathe. There is no cure for asthma, but medicines and lifestyle changes can help control it. There are many things that can bring on an asthma attack or make asthma symptoms worse (triggers). Common triggers include:  Mold.  Dust.  Cigarette smoke.  Cockroaches.  Things that can cause allergy symptoms (allergens). These include animal skin flakes (dander) and pollen from trees or grass.  Things that pollute the air. These may include household cleaners, wood smoke, smog, or chemical odors.  Cold air, weather changes,  and wind.  Crying or laughing hard.  Stress.  Certain medicines or drugs.  Certain foods such as dried fruit, potato chips, and grape juice.  Infections, such as a cold or the flu.  Certain medical conditions or diseases.  Exercise or tiring activities. Asthma may be treated with medicines and by staying away from the things that cause asthma attacks. Types of medicines may include:  Controller medicines. These help prevent asthma symptoms. They are usually taken every day.  Fast-acting reliever or rescue medicines. These quickly relieve asthma symptoms. They are used as needed and provide short-term relief.  Allergy medicines if your attacks are brought on by allergens.  Medicines to help control the body's defense (immune) system. Follow these instructions at home: Avoiding triggers in your home  Change your heating and air conditioning filter  often.  Limit your use of fireplaces and wood stoves.  Get rid of pests (such as roaches and mice) and their droppings.  Throw away plants if you see mold on them.  Clean your floors. Dust regularly. Use cleaning products that do not smell.  Have someone vacuum when you are not home. Use a vacuum cleaner with a HEPA filter if possible.  Replace carpet with wood, tile, or vinyl flooring. Carpet can trap animal skin flakes and dust.  Use allergy-proof pillows, mattress covers, and box spring covers.  Wash bed sheets and blankets every week in hot water. Dry them in a dryer.  Keep your bedroom free of any triggers.  Avoid pets and keep windows closed when things that cause allergy symptoms are in the air.  Use blankets that are made of polyester or cotton.  Clean bathrooms and kitchens with bleach. If possible, have someone repaint the walls in these rooms with mold-resistant paint. Keep out of the rooms that are being cleaned and painted.  Wash your hands often with soap and water. If soap and water are not available, use hand sanitizer.  Do not allow anyone to smoke in your home. General instructions  Take over-the-counter and prescription medicines only as told by your doctor. ? Talk with your doctor if you have questions about how or when to take your medicines. ? Make note if you need to use your medicines more often than usual.  Do not use any products that contain nicotine or tobacco, such as cigarettes and e-cigarettes. If you need help quitting, ask your doctor.  Stay away from secondhand smoke.  Avoid doing things outdoors when allergen counts are high and when air quality is low.  Wear a ski mask when doing outdoor activities in the winter. The mask should cover your nose and mouth. Exercise indoors on cold days if you can.  Warm up before you exercise. Take time to cool down after exercise.  Use a peak flow meter as told by your doctor. A peak flow meter is a  tool that measures how well the lungs are working.  Keep track of the peak flow meter's readings. Write them down.  Follow your asthma action plan. This is a written plan for taking care of your asthma and treating your attacks.  Make sure you get all the shots (vaccines) that your doctor recommends. Ask your doctor about a flu shot and a pneumonia shot.  Keep all follow-up visits as told by your doctor. This is important. Contact a doctor if:  You have wheezing, shortness of breath, or a cough even while taking medicine to prevent attacks.  The mucus you cough  up (sputum) is thicker than usual.  The mucus you cough up changes from clear or white to yellow, green, gray, or bloody.  You have problems from the medicine you are taking, such as: ? A rash. ? Itching. ? Swelling. ? Trouble breathing.  You need reliever medicines more than 2-3 times a week.  Your peak flow reading is still at 50-79% of your personal best after following the action plan for 1 hour.  You have a fever. Get help right away if:  You seem to be worse and are not responding to medicine during an asthma attack.  You are short of breath even at rest.  You get short of breath when doing very little activity.  You have trouble eating, drinking, or talking.  You have chest pain or tightness.  You have a fast heartbeat.  Your lips or fingernails start to turn blue.  You are light-headed or dizzy, or you faint.  Your peak flow is less than 50% of your personal best.  You feel too tired to breathe normally. Summary  Asthma is a long-term (chronic) condition in which the airways get tight and narrow. An asthma attack can make it hard to breathe.  Asthma cannot be cured, but medicines and lifestyle changes can help control it.  Make sure you understand how to avoid triggers and how and when to use your medicines. This information is not intended to replace advice given to you by your health care  provider. Make sure you discuss any questions you have with your health care provider. Document Revised: 10/22/2019 Document Reviewed: 10/22/2019 Elsevier Patient Education  2021 [employer].

## 2020-09-21 NOTE — Progress Notes (Signed)
[facility] [address] [place], [place] [address]  Pulmonary Sleep Medicine   Office Visit Note  Patient Name: Sydney Hobbs DOB: [birth year] MRN [id number]  Date of Service: 09/21/2020  Complaints/HPI: Asthma under better control. Patient is on allergy meds and also is taking breo.  She states that she does get some relief from the medications.  The overall as already noted asthma has been under much better control she has not had any flareups.  She does not had any admissions to the hospital.  ROS  General: (-) fever, (-) chills, (-) night sweats, (-) weakness Skin: (-) rashes, (-) itching,. Eyes: (-) visual changes, (-) redness, (-) itching. Nose and Sinuses: (-) nasal stuffiness or itchiness, (-) postnasal drip, (-) nosebleeds, (-) sinus trouble. Mouth and Throat: (-) sore throat, (-) hoarseness. Neck: (-) swollen glands, (-) enlarged thyroid, (-) neck pain. Respiratory: - cough, (-) bloody sputum, + shortness of breath, + wheezing. Cardiovascular: - ankle swelling, (-) chest pain. Lymphatic: (-) lymph node enlargement. Neurologic: (-) numbness, (-) tingling. Psychiatric: (-) anxiety, (-) depression   Current Medication: Outpatient Encounter Medications as of 09/21/2020  Medication Sig  . albuterol (PROVENTIL HFA;VENTOLIN HFA) 108 (90 Base) MCG/ACT inhaler Inhale 2 puffs into the lungs every 6 (six) hours as needed for wheezing or shortness of breath.  . desloratadine (CLARINEX) 5 MG tablet Take 1 tablet (5 mg total) by mouth daily.  . fluticasone (FLONASE) 50 MCG/ACT nasal spray Place 2 sprays into both nostrils daily.  . fluticasone furoate-vilanterol (BREO ELLIPTA) 100-25 MCG/INH AEPB Inhale 1 puff into the lungs daily.  . montelukast (SINGULAIR) 10 MG tablet Take 1 tablet (10 mg total) by mouth at bedtime.   No facility-administered encounter medications on file as of 09/21/2020.    Surgical History: Past Surgical History:  Procedure Laterality Date  .  CERVICAL DISCECTOMY      Medical History: Past Medical History:  Diagnosis Date  . Acute bronchitis     Family History: Family History  Problem Relation Age of Onset  . Hypertension Mother   . Thyroid cancer Mother   . Skin cancer Mother   . Osteoporosis Mother   . Aortic aneurysm Mother   . Thrombosis Father     Social History: Social History   Socioeconomic History  . Marital status: Married    Spouse name: Not on file  . Number of children: Not on file  . Years of education: Not on file  . Highest education level: Not on file  Occupational History  . Not on file  Tobacco Use  . Smoking status: Former Games developer  . Smokeless tobacco: Never Used  Substance and Sexual Activity  . Alcohol use: Yes    Comment: occ  . Drug use: Never  . Sexual activity: Not on file  Other Topics Concern  . Not on file  Social History Narrative  . Not on file   Social Determinants of Health   Financial Resource Strain: Not on file  Food Insecurity: Not on file  Transportation Needs: Not on file  Physical Activity: Not on file  Stress: Not on file  Social Connections: Not on file  Intimate Partner Violence: Not on file    Vital Signs: Blood pressure 120/72, pulse 99, temperature 98.4 F (36.9 C), resp. rate 16, height 5\' 9"  (1.753 m), weight 219 lb 9.6 oz (99.6 kg), SpO2 99 %.  Examination: General Appearance: The patient is well-developed, well-nourished, and in no distress. Skin: Gross inspection of skin  unremarkable. Head: normocephalic, no gross deformities. Eyes: no gross deformities noted. ENT: ears appear grossly normal no exudates. Neck: Supple. No thyromegaly. No LAD. Respiratory: No rhonchi no rales are noted at this time.. Cardiovascular: Normal S1 and S2 without murmur or rub. Extremities: No cyanosis. pulses are equal. Neurologic: Alert and oriented. No involuntary movements.  LABS: No results found for this or any previous visit (from the past 2160  hour(s)).  Radiology: DG Chest 2 View  Result Date: 06/12/2018 CLINICAL DATA:  Cough EXAM: CHEST - 2 VIEW COMPARISON:  06/03/2018 FINDINGS: Patchy lingular opacity may reflect atelectasis scar or minimal infiltrate. This does not appear significantly changed. Normal heart size. No pneumothorax. Surgical changes in the cervical spine. IMPRESSION: Patchy lingular opacity, without significant change may reflect atelectasis, scar, or minimal infiltrate. Electronically Signed   By: Jasmine Pang M.D.   On: 06/12/2018 20:19   CT Angio Chest PE W and/or Wo Contrast  Result Date: 06/12/2018 CLINICAL DATA:  Pleuritic chest pain and shortness of breath EXAM: CT ANGIOGRAPHY CHEST WITH CONTRAST TECHNIQUE: Multidetector CT imaging of the chest was performed using the standard protocol during bolus administration of intravenous contrast. Multiplanar CT image reconstructions and MIPs were obtained to evaluate the vascular anatomy. CONTRAST:  52mL OMNIPAQUE IOHEXOL 350 MG/ML SOLN COMPARISON:  None. FINDINGS: Cardiovascular: --Pulmonary arteries: Contrast injection is sufficient to demonstrate satisfactory opacification of the pulmonary arteries to the segmental level. There is no pulmonary embolus. The main pulmonary artery is within normal limits for size. --Aorta: Limited opacification of the aorta due to bolus timing optimization for the pulmonary arteries. Conventional 3 vessel aortic branching pattern. The aortic course and caliber are normal. There is no aortic atherosclerosis. --Heart: Normal size. No pericardial effusion. Mediastinum/Nodes: No mediastinal, hilar or axillary lymphadenopathy. The visualized thyroid and thoracic esophageal course are unremarkable. Lungs/Pleura: No pulmonary nodules or masses. No pleural effusion or pneumothorax. No focal airspace consolidation. No focal pleural abnormality. Upper Abdomen: Contrast bolus timing is not optimized for evaluation of the abdominal organs. Within this  limitation, the visualized organs of the upper abdomen are normal. Musculoskeletal: No chest wall abnormality. No acute or significant osseous findings. Review of the MIP images confirms the above findings. IMPRESSION: No pulmonary embolus or other acute thoracic abnormality. Electronically Signed   By: Deatra Robinson M.D.   On: 06/12/2018 22:34    No results found.  No results found.    Assessment and Plan: Patient Active Problem List   Diagnosis Date Noted  . Acute bronchitis     1. Moderate persistent asthma without complication Under better control she will continue with her current inhaler regimen also prescription for renewal of short acting inhalers was given. - albuterol (VENTOLIN HFA) 108 (90 Base) MCG/ACT inhaler; Inhale 2 puffs into the lungs every 6 (six) hours as needed for wheezing or shortness of breath.  Dispense: 1 each; Refill: 0  2. Seasonal allergic rhinitis due to pollen She will use her allergy medications as needed as prescribed.  Again they appears to be under better control we will continue to follow along.  3. SOB (shortness of breath) Improving continue with inhalers as suggested above  General Counseling: I have discussed the findings of the evaluation and examination with Lawson Fiscal.  I have also discussed any further diagnostic evaluation thatmay be needed or ordered today. Cherry verbalizes understanding of the findings of todays visit. We also reviewed her medications today and discussed drug interactions and side effects including but not limited excessive drowsiness  and altered mental states. We also discussed that there is always a risk not just to her but also people around her. she has been encouraged to call the office with any questions or concerns that should arise related to todays visit.  No orders of the defined types were placed in this encounter.    Time spent: 35-minute  I have personally obtained a history, examined the patient, evaluated  laboratory and imaging results, formulated the assessment and plan and placed orders.    Yevonne Pax, MD [facility] Pulmonary and Critical Care Sleep medicine

## 2021-03-22 ENCOUNTER — Encounter: Payer: Self-pay | Admitting: Internal Medicine

## 2021-03-22 ENCOUNTER — Other Ambulatory Visit: Payer: Self-pay

## 2021-03-22 ENCOUNTER — Encounter (INDEPENDENT_AMBULATORY_CARE_PROVIDER_SITE_OTHER): Payer: Self-pay

## 2021-03-22 ENCOUNTER — Ambulatory Visit: Payer: BC Managed Care – PPO | Admitting: Internal Medicine

## 2021-03-22 VITALS — BP 130/80 | HR 100 | Temp 97.8°F | Resp 16 | Ht 69.0 in | Wt 230.4 lb

## 2021-03-22 DIAGNOSIS — J301 Allergic rhinitis due to pollen: Secondary | ICD-10-CM

## 2021-03-22 DIAGNOSIS — J4540 Moderate persistent asthma, uncomplicated: Secondary | ICD-10-CM | POA: Diagnosis not present

## 2021-03-22 DIAGNOSIS — R0602 Shortness of breath: Secondary | ICD-10-CM

## 2021-03-22 NOTE — Patient Instructions (Signed)
Asthma, Adult  Asthma is a long-term (chronic) condition in which the airways get tight and narrow. The airways are the breathing passages that lead from the nose and mouth down into the lungs. A person with asthma will have times when symptoms get worse. These are called asthma attacks. They can cause coughing, whistling sounds when you breathe (wheezing), shortness of breath, and chest pain. They can make it hard to breathe. Thereis no cure for asthma, but medicines and lifestyle changes can help control it. There are many things that can bring on an asthma attack or make asthma symptoms worse (triggers). Common triggers include: Mold. Dust. Cigarette smoke. Cockroaches. Things that can cause allergy symptoms (allergens). These include animal skin flakes (dander) and pollen from trees or grass. Things that pollute the air. These may include household cleaners, wood smoke, smog, or chemical odors. Cold air, weather changes, and wind. Crying or laughing hard. Stress. Certain medicines or drugs. Certain foods such as dried fruit, potato chips, and grape juice. Infections, such as a cold or the flu. Certain medical conditions or diseases. Exercise or tiring activities. Asthma may be treated with medicines and by staying away from the things that cause asthma attacks. Types of medicines may include: Controller medicines. These help prevent asthma symptoms. They are usually taken every day. Fast-acting reliever or rescue medicines. These quickly relieve asthma symptoms. They are used as needed and provide short-term relief. Allergy medicines if your attacks are brought on by allergens. Medicines to help control the body's defense (immune) system. Follow these instructions at home: Avoiding triggers in your home Change your heating and air conditioning filter often. Limit your use of fireplaces and wood stoves. Get rid of pests (such as roaches and mice) and their droppings. Throw away plants  if you see mold on them. Clean your floors. Dust regularly. Use cleaning products that do not smell. Have someone vacuum when you are not home. Use a vacuum cleaner with a HEPA filter if possible. Replace carpet with wood, tile, or vinyl flooring. Carpet can trap animal skin flakes and dust. Use allergy-proof pillows, mattress covers, and box spring covers. Wash bed sheets and blankets every week in hot water. Dry them in a dryer. Keep your bedroom free of any triggers. Avoid pets and keep windows closed when things that cause allergy symptoms are in the air. Use blankets that are made of polyester or cotton. Clean bathrooms and kitchens with bleach. If possible, have someone repaint the walls in these rooms with mold-resistant paint. Keep out of the rooms that are being cleaned and painted. Wash your hands often with soap and water. If soap and water are not available, use hand sanitizer. Do not allow anyone to smoke in your home. General instructions Take over-the-counter and prescription medicines only as told by your doctor. Talk with your doctor if you have questions about how or when to take your medicines. Make note if you need to use your medicines more often than usual. Do not use any products that contain nicotine or tobacco, such as cigarettes and e-cigarettes. If you need help quitting, ask your doctor. Stay away from secondhand smoke. Avoid doing things outdoors when allergen counts are high and when air quality is low. Wear a ski mask when doing outdoor activities in the winter. The mask should cover your nose and mouth. Exercise indoors on cold days if you can. Warm up before you exercise. Take time to cool down after exercise. Use a peak flow meter as   told by your doctor. A peak flow meter is a tool that measures how well the lungs are working. Keep track of the peak flow meter's readings. Write them down. Follow your asthma action plan. This is a written plan for taking care  of your asthma and treating your attacks. Make sure you get all the shots (vaccines) that your doctor recommends. Ask your doctor about a flu shot and a pneumonia shot. Keep all follow-up visits as told by your doctor. This is important. Contact a doctor if: You have wheezing, shortness of breath, or a cough even while taking medicine to prevent attacks. The mucus you cough up (sputum) is thicker than usual. The mucus you cough up changes from clear or white to yellow, green, gray, or bloody. You have problems from the medicine you are taking, such as: A rash. Itching. Swelling. Trouble breathing. You need reliever medicines more than 2-3 times a week. Your peak flow reading is still at 50-79% of your personal best after following the action plan for 1 hour. You have a fever. Get help right away if: You seem to be worse and are not responding to medicine during an asthma attack. You are short of breath even at rest. You get short of breath when doing very little activity. You have trouble eating, drinking, or talking. You have chest pain or tightness. You have a fast heartbeat. Your lips or fingernails start to turn blue. You are light-headed or dizzy, or you faint. Your peak flow is less than 50% of your personal best. You feel too tired to breathe normally. Summary Asthma is a long-term (chronic) condition in which the airways get tight and narrow. An asthma attack can make it hard to breathe. Asthma cannot be cured, but medicines and lifestyle changes can help control it. Make sure you understand how to avoid triggers and how and when to use your medicines. This information is not intended to replace advice given to you by your health care provider. Make sure you discuss any questions you have with your healthcare provider. Document Revised: 10/22/2019 Document Reviewed: 10/22/2019 Elsevier Patient Education  2022 Elsevier Inc.  

## 2021-03-22 NOTE — Progress Notes (Signed)
[facility] [address] [place], [place] [address]  Pulmonary Sleep Medicine   Office Visit Note  Patient Name: Sydney Hobbs DOB: [birth year] MRN [id number]  Date of Service: 03/22/2021  Complaints/HPI: Asthma under control. She is on her breo but does not use all the time mostly in the allergy season. Patient denies CP SOB COUGH CONGESTION. Allergies have been seasonal spring and fall. Meds seem to be working.  She has not had any flareups has not required admission to the hospital.  Denies having any chest pain no palpitations are noted.  Occasional seasonal rhinitis but has not had any major flareups  ROS  General: (-) fever, (-) chills, (-) night sweats, (-) weakness Skin: (-) rashes, (-) itching,. Eyes: (-) visual changes, (-) redness, (-) itching. Nose and Sinuses: (-) nasal stuffiness or itchiness, (-) postnasal drip, (-) nosebleeds, (-) sinus trouble. Mouth and Throat: (-) sore throat, (-) hoarseness. Neck: (-) swollen glands, (-) enlarged thyroid, (-) neck pain. Respiratory: - cough, (-) bloody sputum, - shortness of breath, - wheezing. Cardiovascular: - ankle swelling, (-) chest pain. Lymphatic: (-) lymph node enlargement. Neurologic: (-) numbness, (-) tingling. Psychiatric: (-) anxiety, (-) depression   Current Medication: Outpatient Encounter Medications as of 03/22/2021  Medication Sig   albuterol (VENTOLIN HFA) 108 (90 Base) MCG/ACT inhaler Inhale 2 puffs into the lungs every 6 (six) hours as needed for wheezing or shortness of breath.   desloratadine (CLARINEX) 5 MG tablet Take 1 tablet (5 mg total) by mouth daily.   fluticasone (FLONASE) 50 MCG/ACT nasal spray Place 2 sprays into both nostrils daily.   fluticasone furoate-vilanterol (BREO ELLIPTA) 100-25 MCG/INH AEPB Inhale 1 puff into the lungs daily.   montelukast (SINGULAIR) 10 MG tablet Take 1 tablet (10 mg total) by mouth at bedtime.   montelukast (SINGULAIR) 10 MG tablet Take 1 tablet (10 mg  total) by mouth at bedtime.   No facility-administered encounter medications on file as of 03/22/2021.    Surgical History: Past Surgical History:  Procedure Laterality Date   CERVICAL DISCECTOMY      Medical History: Past Medical History:  Diagnosis Date   Acute bronchitis     Family History: Family History  Problem Relation Age of Onset   Hypertension Mother    Thyroid cancer Mother    Skin cancer Mother    Osteoporosis Mother    Aortic aneurysm Mother    Thrombosis Father     Social History: Social History   Socioeconomic History   Marital status: Married    Spouse name: Not on file   Number of children: Not on file   Years of education: Not on file   Highest education level: Not on file  Occupational History   Not on file  Tobacco Use   Smoking status: Former   Smokeless tobacco: Never  Substance and Sexual Activity   Alcohol use: Yes    Comment: occ   Drug use: Never   Sexual activity: Not on file  Other Topics Concern   Not on file  Social History Narrative   Not on file   Social Determinants of Health   Financial Resource Strain: Not on file  Food Insecurity: Not on file  Transportation Needs: Not on file  Physical Activity: Not on file  Stress: Not on file  Social Connections: Not on file  Intimate Partner Violence: Not on file    Vital Signs: Blood pressure 130/80, pulse 100, temperature 97.8 F (36.6 C), resp. rate 16, height  5\' 9"  (1.753 m), weight 230 lb 6.4 oz (104.5 kg), SpO2 96 %.  Examination: General Appearance: The patient is well-developed, well-nourished, and in no distress. Skin: Gross inspection of skin unremarkable. Head: normocephalic, no gross deformities. Eyes: no gross deformities noted. ENT: ears appear grossly normal no exudates. Neck: Supple. No thyromegaly. No LAD. Respiratory: no rhonchi noted. Cardiovascular: Normal S1 and S2 without murmur or rub. Extremities: No cyanosis. pulses are equal. Neurologic: Alert  and oriented. No involuntary movements.  LABS: No results found for this or any previous visit (from the past 2160 hour(s)).  Radiology: DG Chest 2 View  Result Date: 06/12/2018 CLINICAL DATA:  Cough EXAM: CHEST - 2 VIEW COMPARISON:  06/03/2018 FINDINGS: Patchy lingular opacity may reflect atelectasis scar or minimal infiltrate. This does not appear significantly changed. Normal heart size. No pneumothorax. Surgical changes in the cervical spine. IMPRESSION: Patchy lingular opacity, without significant change may reflect atelectasis, scar, or minimal infiltrate. Electronically Signed   By: 14/08/2017 M.D.   On: 06/12/2018 20:19   CT Angio Chest PE W and/or Wo Contrast  Result Date: 06/12/2018 CLINICAL DATA:  Pleuritic chest pain and shortness of breath EXAM: CT ANGIOGRAPHY CHEST WITH CONTRAST TECHNIQUE: Multidetector CT imaging of the chest was performed using the standard protocol during bolus administration of intravenous contrast. Multiplanar CT image reconstructions and MIPs were obtained to evaluate the vascular anatomy. CONTRAST:  37mL OMNIPAQUE IOHEXOL 350 MG/ML SOLN COMPARISON:  None. FINDINGS: Cardiovascular: --Pulmonary arteries: Contrast injection is sufficient to demonstrate satisfactory opacification of the pulmonary arteries to the segmental level. There is no pulmonary embolus. The main pulmonary artery is within normal limits for size. --Aorta: Limited opacification of the aorta due to bolus timing optimization for the pulmonary arteries. Conventional 3 vessel aortic branching pattern. The aortic course and caliber are normal. There is no aortic atherosclerosis. --Heart: Normal size. No pericardial effusion. Mediastinum/Nodes: No mediastinal, hilar or axillary lymphadenopathy. The visualized thyroid and thoracic esophageal course are unremarkable. Lungs/Pleura: No pulmonary nodules or masses. No pleural effusion or pneumothorax. No focal airspace consolidation. No focal pleural  abnormality. Upper Abdomen: Contrast bolus timing is not optimized for evaluation of the abdominal organs. Within this limitation, the visualized organs of the upper abdomen are normal. Musculoskeletal: No chest wall abnormality. No acute or significant osseous findings. Review of the MIP images confirms the above findings. IMPRESSION: No pulmonary embolus or other acute thoracic abnormality. Electronically Signed   By: 73m M.D.   On: 06/12/2018 22:34    No results found.  No results found.    Assessment and Plan: Patient Active Problem List   Diagnosis Date Noted   Acute bronchitis     1. SOB (shortness of breath) She appears to be doing much better less shortness of breath is noted.  We did do a follow-up spirometry which shows good results - Spirometry with Graph  2. Moderate persistent asthma without complication On inhalers plan is going to be to continue with the current inhaler regimen as prescribed.  3. Seasonal allergic rhinitis due to pollen This is seasonal continue use of antihistamines as necessary appears to be under good control  General Counseling: I have discussed the findings of the evaluation and examination with 14/05/2018.  I have also discussed any further diagnostic evaluation thatmay be needed or ordered today. Bannie verbalizes understanding of the findings of todays visit. We also reviewed her medications today and discussed drug interactions and side effects including but not limited excessive drowsiness and  altered mental states. We also discussed that there is always a risk not just to her but also people around her. she has been encouraged to call the office with any questions or concerns that should arise related to todays visit.  Orders Placed This Encounter  Procedures   Spirometry with Graph    Order Specific Question:   Where should this test be performed?    Answer:   Other     Time spent: 32  I have personally obtained a history, examined the  patient, evaluated laboratory and imaging results, formulated the assessment and plan and placed orders.    Yevonne Pax, MD [facility] Pulmonary and Critical Care Sleep medicine

## 2021-07-25 ENCOUNTER — Other Ambulatory Visit: Payer: Self-pay | Admitting: Internal Medicine

## 2021-07-25 DIAGNOSIS — J4540 Moderate persistent asthma, uncomplicated: Secondary | ICD-10-CM

## 2021-09-23 ENCOUNTER — Other Ambulatory Visit: Payer: Self-pay | Admitting: Internal Medicine

## 2021-09-23 DIAGNOSIS — J4540 Moderate persistent asthma, uncomplicated: Secondary | ICD-10-CM

## 2021-11-26 ENCOUNTER — Other Ambulatory Visit: Payer: Self-pay | Admitting: Internal Medicine

## 2021-11-26 DIAGNOSIS — J4540 Moderate persistent asthma, uncomplicated: Secondary | ICD-10-CM

## 2021-11-27 ENCOUNTER — Other Ambulatory Visit: Payer: Self-pay | Admitting: Internal Medicine

## 2021-11-27 DIAGNOSIS — J4540 Moderate persistent asthma, uncomplicated: Secondary | ICD-10-CM

## 2022-02-08 ENCOUNTER — Other Ambulatory Visit: Payer: Self-pay

## 2022-02-08 MED ORDER — FLUTICASONE FUROATE-VILANTEROL 100-25 MCG/ACT IN AEPB: 1.0000 | INHALATION_SPRAY | Freq: Every day | RESPIRATORY_TRACT | 3 refills | Status: DC

## 2022-03-21 ENCOUNTER — Ambulatory Visit: Payer: BC Managed Care – PPO | Admitting: Internal Medicine

## 2022-03-30 ENCOUNTER — Ambulatory Visit: Payer: BC Managed Care – PPO | Admitting: Physician Assistant

## 2022-03-30 ENCOUNTER — Encounter: Payer: Self-pay | Admitting: Physician Assistant

## 2022-03-30 VITALS — BP 118/75 | HR 83 | Temp 98.4°F | Resp 16 | Ht 69.0 in | Wt 224.6 lb

## 2022-03-30 DIAGNOSIS — J301 Allergic rhinitis due to pollen: Secondary | ICD-10-CM

## 2022-03-30 DIAGNOSIS — R0602 Shortness of breath: Secondary | ICD-10-CM | POA: Diagnosis not present

## 2022-03-30 DIAGNOSIS — J4540 Moderate persistent asthma, uncomplicated: Secondary | ICD-10-CM | POA: Diagnosis not present

## 2022-03-30 MED ORDER — ALBUTEROL SULFATE HFA 108 (90 BASE) MCG/ACT IN AERS: INHALATION_SPRAY | RESPIRATORY_TRACT | 1 refills | Status: DC

## 2022-03-30 MED ORDER — MONTELUKAST SODIUM 10 MG PO TABS: ORAL_TABLET | ORAL | 3 refills | Status: DC

## 2022-03-30 NOTE — Progress Notes (Signed)
[facility] [address] [place], [place] [address]  Pulmonary Sleep Medicine   Office Visit Note  Patient Name: Sydney Hobbs DOB: 1970-11-07 MRN [id number]  Date of Service: 03/30/2022  Complaints/HPI: Pt is here for routine follow up. Her breathing has been stable. She denies any SOB or wheezing. She uses her medications more so based on seasonal changes. She has started back using breo and Singulair now that weather is starting to change again and is doing well with this. Rarely uses albuterol, needs this more so if very hot and humid out. Sydney Hobbs updated today was normal.  ROS  General: (-) fever, (-) chills, (-) night sweats, (-) weakness Skin: (-) rashes, (-) itching,. Eyes: (-) visual changes, (-) redness, (-) itching. Nose and Sinuses: (-) nasal stuffiness or itchiness, (-) postnasal drip, (-) nosebleeds, (-) sinus trouble. Mouth and Throat: (-) sore throat, (-) hoarseness. Neck: (-) swollen glands, (-) enlarged thyroid, (-) neck pain. Respiratory: - cough, (-) bloody sputum, - shortness of breath, - wheezing. Cardiovascular: - ankle swelling, (-) chest pain. Lymphatic: (-) lymph node enlargement. Neurologic: (-) numbness, (-) tingling. Psychiatric: (-) anxiety, (-) depression   Current Medication: Outpatient Encounter Medications as of 03/30/2022  Medication Sig   desloratadine (CLARINEX) 5 MG tablet TAKE 1 TABLET (5 MG TOTAL) BY MOUTH DAILY.   fluticasone (FLONASE) 50 MCG/ACT nasal spray SPRAY 2 SPRAYS INTO EACH NOSTRIL EVERY DAY   fluticasone furoate-vilanterol (BREO ELLIPTA) 100-25 MCG/ACT AEPB Inhale 1 puff into the lungs daily.   [DISCONTINUED] albuterol (VENTOLIN HFA) 108 (90 Base) MCG/ACT inhaler TAKE 2 PUFFS BY MOUTH EVERY 6 HOURS AS NEEDED FOR WHEEZE OR SHORTNESS OF BREATH   [DISCONTINUED] montelukast (SINGULAIR) 10 MG tablet Take 1 tablet (10 mg total) by mouth at bedtime.   [DISCONTINUED] montelukast (SINGULAIR) 10 MG tablet TAKE 1 TABLET BY MOUTH  EVERYDAY AT BEDTIME   albuterol (VENTOLIN HFA) 108 (90 Base) MCG/ACT inhaler TAKE 2 PUFFS BY MOUTH EVERY 6 HOURS AS NEEDED FOR WHEEZE OR SHORTNESS OF BREATH   montelukast (SINGULAIR) 10 MG tablet TAKE 1 TABLET BY MOUTH EVERYDAY AT BEDTIME   No facility-administered encounter medications on file as of 03/30/2022.    Surgical History: Past Surgical History:  Procedure Laterality Date   CERVICAL DISCECTOMY      Medical History: Past Medical History:  Diagnosis Date   Acute bronchitis     Family History: Family History  Problem Relation Age of Onset   Hypertension Mother    Thyroid cancer Mother    Skin cancer Mother    Osteoporosis Mother    Aortic aneurysm Mother    Thrombosis Father     Social History: Social History   Socioeconomic History   Marital status: Married    Spouse name: Not on file   Number of children: Not on file   Years of education: Not on file   Highest education level: Not on file  Occupational History   Not on file  Tobacco Use   Smoking status: Former   Smokeless tobacco: Never  Substance and Sexual Activity   Alcohol use: Yes    Comment: occ   Drug use: Never   Sexual activity: Not on file  Other Topics Concern   Not on file  Social History Narrative   Not on file   Social Determinants of Health   Financial Resource Strain: Not on file  Food Insecurity: Not on file  Transportation Needs: Not on file  Physical Activity: Not on file  Stress: Not on file  Social Connections: Not on file  Intimate Partner Violence: Not on file    Vital Signs: Blood pressure 118/75, pulse 83, temperature 98.4 F (36.9 C), resp. rate 16, height 5\' 9"  (1.753 m), weight 224 lb 9.6 oz (101.9 kg), SpO2 98 %.  Examination: General Appearance: The patient is well-developed, well-nourished, and in no distress. Skin: Gross inspection of skin unremarkable. Head: normocephalic, no gross deformities. Eyes: no gross deformities noted. ENT: ears appear grossly  normal no exudates. Neck: Supple. No thyromegaly. No LAD. Respiratory: Lungs clear to auscultation bilaterally. Cardiovascular: Normal S1 and S2 without murmur or rub. Extremities: No cyanosis. pulses are equal. Neurologic: Alert and oriented. No involuntary movements.  LABS: No results found for this or any previous visit (from the past 2160 hour(s)).  Radiology: CT Angio Chest PE W and/or Wo Contrast  Result Date: 06/12/2018 CLINICAL DATA:  Pleuritic chest pain and shortness of breath EXAM: CT ANGIOGRAPHY CHEST WITH CONTRAST TECHNIQUE: Multidetector CT imaging of the chest was performed using the standard protocol during bolus administration of intravenous contrast. Multiplanar CT image reconstructions and MIPs were obtained to evaluate the vascular anatomy. CONTRAST:  79mL OMNIPAQUE IOHEXOL 350 MG/ML SOLN COMPARISON:  None. FINDINGS: Cardiovascular: --Pulmonary arteries: Contrast injection is sufficient to demonstrate satisfactory opacification of the pulmonary arteries to the segmental level. There is no pulmonary embolus. The main pulmonary artery is within normal limits for size. --Aorta: Limited opacification of the aorta due to bolus timing optimization for the pulmonary arteries. Conventional 3 vessel aortic branching pattern. The aortic course and caliber are normal. There is no aortic atherosclerosis. --Heart: Normal size. No pericardial effusion. Mediastinum/Nodes: No mediastinal, hilar or axillary lymphadenopathy. The visualized thyroid and thoracic esophageal course are unremarkable. Lungs/Pleura: No pulmonary nodules or masses. No pleural effusion or pneumothorax. No focal airspace consolidation. No focal pleural abnormality. Upper Abdomen: Contrast bolus timing is not optimized for evaluation of the abdominal organs. Within this limitation, the visualized organs of the upper abdomen are normal. Musculoskeletal: No chest wall abnormality. No acute or significant osseous findings. Review  of the MIP images confirms the above findings. IMPRESSION: No pulmonary embolus or other acute thoracic abnormality. Electronically Signed   By: 73m M.D.   On: 06/12/2018 22:34   DG Chest 2 View  Result Date: 06/12/2018 CLINICAL DATA:  Cough EXAM: CHEST - 2 VIEW COMPARISON:  06/03/2018 FINDINGS: Patchy lingular opacity may reflect atelectasis scar or minimal infiltrate. This does not appear significantly changed. Normal heart size. No pneumothorax. Surgical changes in the cervical spine. IMPRESSION: Patchy lingular opacity, without significant change may reflect atelectasis, scar, or minimal infiltrate. Electronically Signed   By: 14/08/2017 M.D.   On: 06/12/2018 20:19    No results found.  No results found.    Assessment and Plan: Patient Active Problem List   Diagnosis Date Noted   Acute bronchitis     1. Moderate persistent asthma without complication Continue inhalers as prescribed - montelukast (SINGULAIR) 10 MG tablet; TAKE 1 TABLET BY MOUTH EVERYDAY AT BEDTIME  Dispense: 30 tablet; Refill: 3 - albuterol (VENTOLIN HFA) 108 (90 Base) MCG/ACT inhaler; TAKE 2 PUFFS BY MOUTH EVERY 6 HOURS AS NEEDED FOR WHEEZE OR SHORTNESS OF BREATH  Dispense: 6.7 each; Refill: 1  2. SOB (shortness of breath) - Spirometry with graph is normal  3. Seasonal allergic rhinitis due to pollen Continue antihistamine   General Counseling: I have discussed the findings of the evaluation and examination with 14/05/2018.  I have also discussed any further diagnostic evaluation thatmay be needed or ordered today. Sydney Hobbs verbalizes understanding of the findings of todays visit. We also reviewed her medications today and discussed drug interactions and side effects including but not limited excessive drowsiness and altered mental states. We also discussed that there is always a risk not just to her but also people around her. she has been encouraged to call the office with any questions or concerns that  should arise related to todays visit.  Orders Placed This Encounter  Procedures   Spirometry with graph    Order Specific Question:   Where should this test be performed?    Answer:   [facility]     Time spent: 65  I have personally obtained a history, examined the patient, evaluated laboratory and imaging results, formulated the assessment and plan and placed orders. This patient was seen by Drema Dallas, PA-C in collaboration with Dr. Devona Konig as a part of collaborative care agreement.     Allyne Gee, MD [facility] Pulmonary and Critical Care Sleep medicine

## 2022-06-30 ENCOUNTER — Other Ambulatory Visit: Payer: Self-pay | Admitting: Internal Medicine

## 2022-06-30 DIAGNOSIS — J4540 Moderate persistent asthma, uncomplicated: Secondary | ICD-10-CM

## 2022-09-16 ENCOUNTER — Other Ambulatory Visit: Payer: Self-pay | Admitting: Physician Assistant

## 2022-09-16 DIAGNOSIS — J4540 Moderate persistent asthma, uncomplicated: Secondary | ICD-10-CM

## 2023-01-10 ENCOUNTER — Other Ambulatory Visit: Payer: Self-pay | Admitting: Physician Assistant

## 2023-01-10 DIAGNOSIS — J4540 Moderate persistent asthma, uncomplicated: Secondary | ICD-10-CM

## 2023-03-29 ENCOUNTER — Ambulatory Visit: Payer: BC Managed Care – PPO | Admitting: Physician Assistant

## 2023-04-02 ENCOUNTER — Ambulatory Visit: Payer: BC Managed Care – PPO | Admitting: Physician Assistant

## 2023-04-05 ENCOUNTER — Ambulatory Visit (INDEPENDENT_AMBULATORY_CARE_PROVIDER_SITE_OTHER): Payer: BC Managed Care – PPO | Admitting: Physician Assistant

## 2023-04-05 ENCOUNTER — Encounter: Payer: Self-pay | Admitting: Physician Assistant

## 2023-04-05 VITALS — BP 110/80 | HR 101 | Temp 98.1°F | Resp 16 | Ht 69.0 in | Wt 225.0 lb

## 2023-04-05 DIAGNOSIS — J301 Allergic rhinitis due to pollen: Secondary | ICD-10-CM | POA: Diagnosis not present

## 2023-04-05 DIAGNOSIS — J4540 Moderate persistent asthma, uncomplicated: Secondary | ICD-10-CM | POA: Diagnosis not present

## 2023-04-05 DIAGNOSIS — R0602 Shortness of breath: Secondary | ICD-10-CM

## 2023-04-05 MED ORDER — FLUTICASONE FUROATE-VILANTEROL 100-25 MCG/ACT IN AEPB: 1.0000 | INHALATION_SPRAY | Freq: Every day | RESPIRATORY_TRACT | 3 refills | Status: DC

## 2023-04-05 MED ORDER — ALBUTEROL SULFATE HFA 108 (90 BASE) MCG/ACT IN AERS: INHALATION_SPRAY | RESPIRATORY_TRACT | 1 refills | Status: DC

## 2023-04-05 MED ORDER — MONTELUKAST SODIUM 10 MG PO TABS: ORAL_TABLET | ORAL | 3 refills | Status: DC

## 2023-04-05 NOTE — Progress Notes (Signed)
[facility] [address] [place], [place] [address]  Pulmonary Sleep Medicine   Office Visit Note  Patient Name: Sydney Hobbs DOB: 1970/10/14 MRN [id number]  Date of Service: 04/10/2023  Complaints/HPI: Pt is here for pulmonary follow up. Breathing generally stable. This time of year is the worst for her. Did start using breo again recently and did need albuterol earlier this week when wheezing started, but this resolved once back on breo and doing well today. Normally does not need albuterol. Does take singulair and clarinex most days with breo only during bad seasons.  ROS  General: (-) fever, (-) chills, (-) night sweats, (-) weakness Skin: (-) rashes, (-) itching,. Eyes: (-) visual changes, (-) redness, (-) itching. Nose and Sinuses: (-) nasal stuffiness or itchiness, (-) postnasal drip, (-) nosebleeds, (-) sinus trouble. Mouth and Throat: (-) sore throat, (-) hoarseness. Neck: (-) swollen glands, (-) enlarged thyroid, (-) neck pain. Respiratory: - cough, (-) bloody sputum, - shortness of breath, - wheezing. Cardiovascular: - ankle swelling, (-) chest pain. Lymphatic: (-) lymph node enlargement. Neurologic: (-) numbness, (-) tingling. Psychiatric: (-) anxiety, (-) depression   Current Medication: Outpatient Encounter Medications as of 04/05/2023  Medication Sig   desloratadine (CLARINEX) 5 MG tablet TAKE 1 TABLET (5 MG TOTAL) BY MOUTH DAILY.   [DISCONTINUED] albuterol (VENTOLIN HFA) 108 (90 Base) MCG/ACT inhaler TAKE 2 PUFFS BY MOUTH EVERY 6 HOURS AS NEEDED FOR WHEEZE OR SHORTNESS OF BREATH   [DISCONTINUED] fluticasone (FLONASE) 50 MCG/ACT nasal spray SPRAY 2 SPRAYS INTO EACH NOSTRIL EVERY DAY   [DISCONTINUED] fluticasone furoate-vilanterol (BREO ELLIPTA) 100-25 MCG/ACT AEPB Inhale 1 puff into the lungs daily.   [DISCONTINUED] montelukast (SINGULAIR) 10 MG tablet TAKE 1 TABLET BY MOUTH EVERYDAY AT BEDTIME   albuterol (VENTOLIN HFA) 108 (90 Base) MCG/ACT inhaler  TAKE 2 PUFFS BY MOUTH EVERY 6 HOURS AS NEEDED FOR WHEEZE OR SHORTNESS OF BREATH   fluticasone furoate-vilanterol (BREO ELLIPTA) 100-25 MCG/ACT AEPB Inhale 1 puff into the lungs daily.   montelukast (SINGULAIR) 10 MG tablet TAKE 1 TABLET BY MOUTH EVERYDAY AT BEDTIME   No facility-administered encounter medications on file as of 04/05/2023.    Surgical History: Past Surgical History:  Procedure Laterality Date   CERVICAL DISCECTOMY      Medical History: Past Medical History:  Diagnosis Date   Acute bronchitis     Family History: Family History  Problem Relation Age of Onset   Hypertension Mother    Thyroid cancer Mother    Skin cancer Mother    Osteoporosis Mother    Aortic aneurysm Mother    Thrombosis Father     Social History: Social History   Socioeconomic History   Marital status: Married    Spouse name: Not on file   Number of children: Not on file   Years of education: Not on file   Highest education level: Not on file  Occupational History   Not on file  Tobacco Use   Smoking status: Former   Smokeless tobacco: Never  Substance and Sexual Activity   Alcohol use: Yes    Comment: occ   Drug use: Never   Sexual activity: Not on file  Other Topics Concern   Not on file  Social History Narrative   Not on file   Social Determinants of Health   Financial Resource Strain: Not on file  Food Insecurity: Not on file  Transportation Needs: Not on file  Physical Activity: Not on file  Stress: Not on file  Social Connections: Not on file  Intimate Partner Violence: Not on file    Vital Signs: Blood pressure 110/80, pulse (!) 101, temperature 98.1 F (36.7 C), resp. rate 16, height 5\' 9"  (1.753 m), weight 225 lb (102.1 kg), SpO2 97%, peak flow (!) 4 L/min.  Examination: General Appearance: The patient is well-developed, well-nourished, and in no distress. Skin: Gross inspection of skin unremarkable. Head: normocephalic, no gross deformities. Eyes: no  gross deformities noted. ENT: ears appear grossly normal no exudates. Neck: Supple. No thyromegaly. No LAD. Respiratory: Lungs clear to auscultation bilaterally. Cardiovascular: Normal S1 and S2 without murmur or rub. Extremities: No cyanosis. pulses are equal. Neurologic: Alert and oriented. No involuntary movements.  LABS: No results found for this or any previous visit (from the past 2160 hour(s)).  Radiology: CT Angio Chest PE W and/or Wo Contrast  Result Date: 06/12/2018 CLINICAL DATA:  Pleuritic chest pain and shortness of breath EXAM: CT ANGIOGRAPHY CHEST WITH CONTRAST TECHNIQUE: Multidetector CT imaging of the chest was performed using the standard protocol during bolus administration of intravenous contrast. Multiplanar CT image reconstructions and MIPs were obtained to evaluate the vascular anatomy. CONTRAST:  75mL OMNIPAQUE IOHEXOL 350 MG/ML SOLN COMPARISON:  None. FINDINGS: Cardiovascular: --Pulmonary arteries: Contrast injection is sufficient to demonstrate satisfactory opacification of the pulmonary arteries to the segmental level. There is no pulmonary embolus. The main pulmonary artery is within normal limits for size. --Aorta: Limited opacification of the aorta due to bolus timing optimization for the pulmonary arteries. Conventional 3 vessel aortic branching pattern. The aortic course and caliber are normal. There is no aortic atherosclerosis. --Heart: Normal size. No pericardial effusion. Mediastinum/Nodes: No mediastinal, hilar or axillary lymphadenopathy. The visualized thyroid and thoracic esophageal course are unremarkable. Lungs/Pleura: No pulmonary nodules or masses. No pleural effusion or pneumothorax. No focal airspace consolidation. No focal pleural abnormality. Upper Abdomen: Contrast bolus timing is not optimized for evaluation of the abdominal organs. Within this limitation, the visualized organs of the upper abdomen are normal. Musculoskeletal: No chest wall  abnormality. No acute or significant osseous findings. Review of the MIP images confirms the above findings. IMPRESSION: No pulmonary embolus or other acute thoracic abnormality. Electronically Signed   By: Deatra Robinson M.D.   On: 06/12/2018 22:34   DG Chest 2 View  Result Date: 06/12/2018 CLINICAL DATA:  Cough EXAM: CHEST - 2 VIEW COMPARISON:  06/03/2018 FINDINGS: Patchy lingular opacity may reflect atelectasis scar or minimal infiltrate. This does not appear significantly changed. Normal heart size. No pneumothorax. Surgical changes in the cervical spine. IMPRESSION: Patchy lingular opacity, without significant change may reflect atelectasis, scar, or minimal infiltrate. Electronically Signed   By: Jasmine Pang M.D.   On: 06/12/2018 20:19    No results found.  No results found.    Assessment and Plan: Patient Active Problem List   Diagnosis Date Noted   Acute bronchitis     1. Moderate persistent asthma without complication Continue inhalers as before - albuterol (VENTOLIN HFA) 108 (90 Base) MCG/ACT inhaler; TAKE 2 PUFFS BY MOUTH EVERY 6 HOURS AS NEEDED FOR WHEEZE OR SHORTNESS OF BREATH  Dispense: 6.7 each; Refill: 1 - montelukast (SINGULAIR) 10 MG tablet; TAKE 1 TABLET BY MOUTH EVERYDAY AT BEDTIME  Dispense: 90 tablet; Refill: 3  2. SOB (shortness of breath) - Spirometry with graph is normal  3. Seasonal allergic rhinitis due to pollen May continue singulair and clarinex as before   General Counseling: I have discussed the findings of the evaluation and examination  with Sydney Hobbs.  I have also discussed any further diagnostic evaluation thatmay be needed or ordered today. Sydney Hobbs verbalizes understanding of the findings of todays visit. We also reviewed her medications today and discussed drug interactions and side effects including but not limited excessive drowsiness and altered mental states. We also discussed that there is always a risk not just to her but also people around her. she  has been encouraged to call the office with any questions or concerns that should arise related to todays visit.  Orders Placed This Encounter  Procedures   Spirometry with graph    Order Specific Question:   Where should this test be performed?    Answer:   [facility]    Order Specific Question:   Basic spirometry    Answer:   Yes     Time spent: 30  I have personally obtained a history, examined the patient, evaluated laboratory and imaging results, formulated the assessment and plan and placed orders. This patient was seen by Lynn Ito, PA-C in collaboration with Dr. Freda Munro as a part of collaborative care agreement.     Yevonne Pax, MD [facility] Pulmonary and Critical Care Sleep medicine

## 2023-04-10 ENCOUNTER — Other Ambulatory Visit: Payer: Self-pay | Admitting: Internal Medicine

## 2023-05-01 ENCOUNTER — Other Ambulatory Visit: Payer: Self-pay

## 2023-05-01 MED ORDER — FLUTICASONE FUROATE-VILANTEROL 100-25 MCG/ACT IN AEPB: 1.0000 | INHALATION_SPRAY | Freq: Every day | RESPIRATORY_TRACT | 3 refills | Status: AC

## 2023-05-24 ENCOUNTER — Other Ambulatory Visit: Payer: Self-pay | Admitting: Physician Assistant

## 2023-05-24 DIAGNOSIS — J4540 Moderate persistent asthma, uncomplicated: Secondary | ICD-10-CM

## 2023-07-05 ENCOUNTER — Other Ambulatory Visit: Payer: Self-pay | Admitting: Physician Assistant

## 2023-07-05 DIAGNOSIS — J4540 Moderate persistent asthma, uncomplicated: Secondary | ICD-10-CM

## 2023-11-05 ENCOUNTER — Encounter: Payer: Self-pay | Admitting: Nurse Practitioner

## 2023-11-05 ENCOUNTER — Ambulatory Visit: Admitting: Nurse Practitioner

## 2023-11-05 VITALS — BP 120/80 | HR 80 | Temp 98.4°F | Resp 16 | Ht 69.0 in | Wt 210.4 lb

## 2023-11-05 DIAGNOSIS — J4540 Moderate persistent asthma, uncomplicated: Secondary | ICD-10-CM

## 2023-11-05 DIAGNOSIS — J301 Allergic rhinitis due to pollen: Secondary | ICD-10-CM

## 2023-11-05 MED ORDER — PREDNISONE 10 MG (21) PO TBPK: ORAL_TABLET | ORAL | 0 refills | Status: AC

## 2023-11-05 MED ORDER — LEVOCETIRIZINE DIHYDROCHLORIDE 5 MG PO TABS: 5.0000 mg | ORAL_TABLET | Freq: Every evening | ORAL | Status: AC

## 2023-11-05 NOTE — Patient Instructions (Addendum)
 STOP desloratadine   START levocetirizine 5 mg daily   Take 6-day prednisone  taper until finished.

## 2023-11-05 NOTE — Progress Notes (Signed)
 [facility] [address] [place], [place] [address]  Internal MEDICINE  Office Visit Note  Patient Name: Sydney Hobbs  [id number]  [id number]  Date of Service: 11/05/2023  Chief Complaint  Patient presents with   Acute Visit    Congested, runny nose. Tried over the counter medicine. X weeks      HPI Sydney Hobbs presents for an acute sick visit for allergic rhinitis.  --onset of symptoms was a few weeks ago -- nasal congestion, runny nose, sinus pressure, cough, chest tightness sometimes, and minimal wheezing --denies any sore throat, faitgue, fever, chills, SOB, or headache Sinus drainage is clear.      Current Medication:  Outpatient Encounter Medications as of 11/05/2023  Medication Sig   albuterol  (VENTOLIN  HFA) 108 (90 Base) MCG/ACT inhaler TAKE 2 PUFFS BY MOUTH EVERY 6 HOURS AS NEEDED FOR WHEEZE OR SHORTNESS OF BREATH   fluticasone  furoate-vilanterol (BREO ELLIPTA ) 100-25 MCG/ACT AEPB Inhale 1 puff into the lungs daily.   levocetirizine (XYZAL) 5 MG tablet Take 1 tablet (5 mg total) by mouth every evening.   montelukast  (SINGULAIR ) 10 MG tablet TAKE 1 TABLET BY MOUTH EVERYDAY AT BEDTIME   predniSONE  (STERAPRED UNI-PAK 21 TAB) 10 MG (21) TBPK tablet Use as directed for 6 days   [DISCONTINUED] desloratadine  (CLARINEX ) 5 MG tablet TAKE 1 TABLET (5 MG TOTAL) BY MOUTH DAILY.   fluticasone  (FLONASE ) 50 MCG/ACT nasal spray Place 2 sprays into the nose.   No facility-administered encounter medications on file as of 11/05/2023.      Medical History: Past Medical History:  Diagnosis Date   Acute bronchitis      Vital Signs: BP 120/80   Pulse 80   Temp 98.4 F (36.9 C)   Resp 16   Ht 5\' 9"  (1.753 m)   Wt 210 lb 6.4 oz (95.4 kg)   SpO2 98%   BMI 31.07 kg/m    Review of Systems  Constitutional:  Negative for chills, fatigue and fever.  HENT:  Positive for congestion, postnasal drip, rhinorrhea and sinus pressure. Negative for ear discharge, ear pain, sinus  pain, sneezing, sore throat and trouble swallowing.   Respiratory:  Positive for cough and chest tightness (intermittent). Negative for shortness of breath and wheezing.   Cardiovascular:  Negative for chest pain and palpitations.  Neurological:  Negative for headaches.    Physical Exam Vitals reviewed.  Constitutional:      General: She is not in acute distress.    Appearance: Normal appearance. She is obese. She is not ill-appearing.  HENT:     Head: Normocephalic and atraumatic.     Right Ear: Tympanic membrane, ear canal and external ear normal.     Left Ear: There is impacted cerumen.     Nose: Congestion and rhinorrhea present.     Mouth/Throat:     Mouth: Mucous membranes are moist.     Pharynx: Posterior oropharyngeal erythema present.  Eyes:     Extraocular Movements: Extraocular movements intact.     Conjunctiva/sclera: Conjunctivae normal.     Pupils: Pupils are equal, round, and reactive to light.  Cardiovascular:     Rate and Rhythm: Normal rate and regular rhythm.     Heart sounds: Normal heart sounds. No murmur heard. Pulmonary:     Effort: Pulmonary effort is normal. No respiratory distress.     Breath sounds: Normal breath sounds. No wheezing.  Musculoskeletal:     Cervical back: Normal range of motion and neck supple.  Lymphadenopathy:  Cervical: No cervical adenopathy.  Skin:    General: Skin is warm and dry.     Capillary Refill: Capillary refill takes less than 2 seconds.  Neurological:     Mental Status: She is alert and oriented to person, place, and time.  Psychiatric:        Mood and Affect: Mood normal.        Behavior: Behavior normal.       Assessment/Plan: 1. Seasonal allergic rhinitis due to pollen (Primary) Discontinue desloratadine   Start levocetirizine, continue montelukast  as prescribed.  - fluticasone  (FLONASE ) 50 MCG/ACT nasal spray; Place 2 sprays into the nose. - predniSONE  (STERAPRED UNI-PAK 21 TAB) 10 MG (21) TBPK tablet;  Use as directed for 6 days  Dispense: 21 tablet; Refill: 0 - levocetirizine (XYZAL) 5 MG tablet; Take 1 tablet (5 mg total) by mouth every evening.  2. Moderate persistent asthma without complication Continue montelukast  as prescribed.     General Counseling: Sydney Hobbs verbalizes understanding of the findings of todays visit and agrees with plan of treatment. I have discussed any further diagnostic evaluation that may be needed or ordered today. We also reviewed her medications today. she has been encouraged to call the office with any questions or concerns that should arise related to todays visit.    Counseling:    No orders of the defined types were placed in this encounter.   Meds ordered this encounter  Medications   predniSONE  (STERAPRED UNI-PAK 21 TAB) 10 MG (21) TBPK tablet    Sig: Use as directed for 6 days    Dispense:  21 tablet    Refill:  0   levocetirizine (XYZAL) 5 MG tablet    Sig: Take 1 tablet (5 mg total) by mouth every evening.    Return if symptoms worsen or fail to improve.  [place] Controlled Substance Database was reviewed by me for overdose risk score (ORS)  Time spent:20 Minutes Time spent with patient included reviewing progress notes, labs, imaging studies, and discussing plan for follow up.   This patient was seen by Laurence Pons, FNP-C in collaboration with Dr. Verneta Gone as a part of collaborative care agreement.  Jacora Hopkins R. Bobbi Burow, MSN, FNP-C Internal Medicine

## 2024-01-05 ENCOUNTER — Other Ambulatory Visit: Payer: Self-pay | Admitting: Physician Assistant

## 2024-01-05 DIAGNOSIS — J4540 Moderate persistent asthma, uncomplicated: Secondary | ICD-10-CM

## 2024-04-03 ENCOUNTER — Ambulatory Visit: Payer: Self-pay | Admitting: Physician Assistant

## 2024-04-15 ENCOUNTER — Encounter: Payer: Self-pay | Admitting: Internal Medicine

## 2024-04-15 ENCOUNTER — Ambulatory Visit: Admitting: Internal Medicine

## 2024-04-15 VITALS — BP 111/71 | HR 67 | Temp 98.3°F | Resp 16 | Ht 69.0 in | Wt 200.6 lb

## 2024-04-15 DIAGNOSIS — J301 Allergic rhinitis due to pollen: Secondary | ICD-10-CM

## 2024-04-15 DIAGNOSIS — R0602 Shortness of breath: Secondary | ICD-10-CM | POA: Diagnosis not present

## 2024-04-15 DIAGNOSIS — J4540 Moderate persistent asthma, uncomplicated: Secondary | ICD-10-CM | POA: Diagnosis not present

## 2024-04-15 NOTE — Progress Notes (Signed)
 [facility] [address] [place], [place] [address]  Pulmonary Sleep Medicine   Office Visit Note  Patient Name: Sydney Hobbs DOB: 1970-10-19 MRN [id number]  Date of Service: 04/15/2024  Complaints/HPI: She is doing well overall. She states she has been better control with allergies. Patient has had to go on prednisone  in the summer. She does have a rescue inhaler with albuterol  and also has breo. Her FEV1 today is 2.5L and is 86% which is a marked improvement from previous PFT. She wants to defer doing full PFT  Office Spirometry Results: Peak Flow: (!) 4 L/min FEV1: 2.83 liters FVC: 3.67 liters FEV1/FVC: 77.1 % FVC  % Predicted: 88 % FEV % Predicted: 86 % FeF 25-75: 2.57 liters FeF 25-75 % Predicted: 85   ROS  General: (-) fever, (-) chills, (-) night sweats, (-) weakness Skin: (-) rashes, (-) itching,. Eyes: (-) visual changes, (-) redness, (-) itching. Nose and Sinuses: (-) nasal stuffiness or itchiness, (-) postnasal drip, (-) nosebleeds, (-) sinus trouble. Mouth and Throat: (-) sore throat, (-) hoarseness. Neck: (-) swollen glands, (-) enlarged thyroid, (-) neck pain. Respiratory: - cough, (-) bloody sputum, + shortness of breath, + wheezing. Cardiovascular: - ankle swelling, (-) chest pain. Lymphatic: (-) lymph node enlargement. Neurologic: (-) numbness, (-) tingling. Psychiatric: (-) anxiety, (-) depression   Current Medication: Outpatient Encounter Medications as of 04/15/2024  Medication Sig   albuterol  (VENTOLIN  HFA) 108 (90 Base) MCG/ACT inhaler TAKE 2 PUFFS BY MOUTH EVERY 6 HOURS AS NEEDED FOR WHEEZE OR SHORTNESS OF BREATH   fluticasone  (FLONASE ) 50 MCG/ACT nasal spray Place 2 sprays into the nose.   fluticasone  furoate-vilanterol (BREO ELLIPTA ) 100-25 MCG/ACT AEPB Inhale 1 puff into the lungs daily.   levocetirizine (XYZAL ) 5 MG tablet Take 1 tablet (5 mg total) by mouth every evening.   montelukast  (SINGULAIR ) 10 MG tablet TAKE 1 TABLET BY  MOUTH EVERYDAY AT BEDTIME   predniSONE  (STERAPRED UNI-PAK 21 TAB) 10 MG (21) TBPK tablet Use as directed for 6 days   No facility-administered encounter medications on file as of 04/15/2024.    Surgical History: Past Surgical History:  Procedure Laterality Date   CERVICAL DISCECTOMY      Medical History: Past Medical History:  Diagnosis Date   Acute bronchitis     Family History: Family History  Problem Relation Age of Onset   Hypertension Mother    Thyroid cancer Mother    Skin cancer Mother    Osteoporosis Mother    Aortic aneurysm Mother    Thrombosis Father     Social History: Social History   Socioeconomic History   Marital status: Married    Spouse name: Not on file   Number of children: Not on file   Years of education: Not on file   Highest education level: Not on file  Occupational History   Not on file  Tobacco Use   Smoking status: Former   Smokeless tobacco: Never  Substance and Sexual Activity   Alcohol use: Yes    Comment: occ   Drug use: Never   Sexual activity: Not on file  Other Topics Concern   Not on file  Social History Narrative   Not on file   Social Drivers of Health   Financial Resource Strain: Not on file  Food Insecurity: Not on file  Transportation Needs: Not on file  Physical Activity: Not on file  Stress: Not on file  Social Connections: Not on file  Intimate Partner Violence:  Not on file    Vital Signs: Blood pressure 111/71, pulse 67, temperature 98.3 F (36.8 C), resp. rate 16, height 5' 9 (1.753 m), weight 200 lb 9.6 oz (91 kg), SpO2 98%, peak flow (!) 4 L/min.  Examination: General Appearance: The patient is well-developed, well-nourished, and in no distress. Skin: Gross inspection of skin unremarkable. Head: normocephalic, no gross deformities. Eyes: no gross deformities noted. ENT: ears appear grossly normal no exudates. Neck: Supple. No thyromegaly. No LAD. Respiratory: no rhonchi noted. Cardiovascular:  Normal S1 and S2 without murmur or rub. Extremities: No cyanosis. pulses are equal. Neurologic: Alert and oriented. No involuntary movements.  LABS: No results found for this or any previous visit (from the past 2160 hours).  Radiology: CT Angio Chest PE W and/or Wo Contrast Result Date: 06/12/2018 CLINICAL DATA:  Pleuritic chest pain and shortness of breath EXAM: CT ANGIOGRAPHY CHEST WITH CONTRAST TECHNIQUE: Multidetector CT imaging of the chest was performed using the standard protocol during bolus administration of intravenous contrast. Multiplanar CT image reconstructions and MIPs were obtained to evaluate the vascular anatomy. CONTRAST:  75mL OMNIPAQUE  IOHEXOL  350 MG/ML SOLN COMPARISON:  None. FINDINGS: Cardiovascular: --Pulmonary arteries: Contrast injection is sufficient to demonstrate satisfactory opacification of the pulmonary arteries to the segmental level. There is no pulmonary embolus. The main pulmonary artery is within normal limits for size. --Aorta: Limited opacification of the aorta due to bolus timing optimization for the pulmonary arteries. Conventional 3 vessel aortic branching pattern. The aortic course and caliber are normal. There is no aortic atherosclerosis. --Heart: Normal size. No pericardial effusion. Mediastinum/Nodes: No mediastinal, hilar or axillary lymphadenopathy. The visualized thyroid and thoracic esophageal course are unremarkable. Lungs/Pleura: No pulmonary nodules or masses. No pleural effusion or pneumothorax. No focal airspace consolidation. No focal pleural abnormality. Upper Abdomen: Contrast bolus timing is not optimized for evaluation of the abdominal organs. Within this limitation, the visualized organs of the upper abdomen are normal. Musculoskeletal: No chest wall abnormality. No acute or significant osseous findings. Review of the MIP images confirms the above findings. IMPRESSION: No pulmonary embolus or other acute thoracic abnormality. Electronically  Signed   By: Franky Stanford M.D.   On: 06/12/2018 22:34   DG Chest 2 View Result Date: 06/12/2018 CLINICAL DATA:  Cough EXAM: CHEST - 2 VIEW COMPARISON:  06/03/2018 FINDINGS: Patchy lingular opacity may reflect atelectasis scar or minimal infiltrate. This does not appear significantly changed. Normal heart size. No pneumothorax. Surgical changes in the cervical spine. IMPRESSION: Patchy lingular opacity, without significant change may reflect atelectasis, scar, or minimal infiltrate. Electronically Signed   By: Luke Bun M.D.   On: 06/12/2018 20:19    No results found.  No results found.  Assessment and Plan: Patient Active Problem List   Diagnosis Date Noted   Acute bronchitis     1. SOB (shortness of breath) (Primary) She appears to be at bas[place]. Spiro looks better - Spirometry with Graph  2. Moderate persistent asthma without complication Under fairly good control. Try to use breo more frequently  3. Seasonal allergic rhinitis due to pollen On antihistamines will contonune   General Counseling: I have discussed the findings of the evaluation and examination with Katheryn.  I have also discussed any further diagnostic evaluation thatmay be needed or ordered today. Malgorzata verbalizes understanding of the findings of todays visit. We also reviewed her medications today and discussed drug interactions and side effects including but not limited excessive drowsiness and altered mental states. We also discussed that there  is always a risk not just to her but also people around her. she has been encouraged to call the office with any questions or concerns that should arise related to todays visit.  Orders Placed This Encounter  Procedures   Spirometry with Graph    Where should this test be performed?:   [facility]     Time spent: 71  I have personally obtained a history, examined the patient, evaluated laboratory and imaging results, formulated the assessment and plan and  placed orders.    Elfreda DELENA Bathe, MD [facility] Pulmonary and Critical Care Sleep medicine

## 2024-04-15 NOTE — Patient Instructions (Signed)
 Asthma, Adult  Asthma is a condition that causes swelling and narrowing of the airways. These are the passages that lead from the nose and mouth down into the lungs. When asthma symptoms get worse it is called an asthma attack or flare. This can make it hard to breathe. Asthma flares can range from minor to life-threatening. There is no cure for asthma, but medicines and lifestyle changes can help to control it. What are the causes? It is not known exactly what causes asthma, but certain things can cause asthma symptoms to get worse (triggers). What can trigger an asthma attack? Cigarette smoke. Mold. Dust. Your pet's skin flakes (dander). Cockroaches. Pollen. Air pollution (like household cleaners, wood smoke, smog, or Therapist, occupational). What are the signs or symptoms? Trouble breathing (shortness of breath). Coughing. Making high-pitched whistling sounds when you breathe, most often when you breathe out (wheezing). Chest tightness. Tiredness with little activity. Poor exercise tolerance. How is this treated? Controller medicines that help prevent asthma symptoms. Fast-acting reliever or rescue medicines. These give short-term relief of asthma symptoms. Allergy medicines if your attacks are brought on by allergens. Medicines to help control the body's defense (immune) system. Staying away from the things that cause asthma attacks. Follow these instructions at home: Avoiding triggers in your home Do not allow anyone to smoke in your home. Limit use of fireplaces and wood stoves. Get rid of pests (such as roaches and mice) and their droppings. Keep your home clean. Clean your floors. Dust regularly. Use cleaning products that do not smell. Wash bed sheets and blankets every week in hot water. Dry them in a dryer. Have someone vacuum when you are not home. Change your heating and air conditioning filters often. Use blankets that are made of polyester or cotton. General  instructions Take over-the-counter and prescription medicines only as told by your doctor. Do not smoke or use any products that contain nicotine or tobacco. If you need help quitting, ask your doctor. Stay away from secondhand smoke. Avoid doing things outdoors when allergen counts are high and when air quality is low. Warm up before you exercise. Take time to cool down after exercise. Use a peak flow meter as told by your doctor. A peak flow meter is a tool that measures how well your lungs are working. Keep track of the peak flow meter's readings. Write them down. Follow your asthma action plan. This is a written plan for taking care of your asthma and treating your attacks. Make sure you get all the shots (vaccines) that your doctor recommends. Ask your doctor about a flu shot and a pneumonia shot. Keep all follow-up visits. Contact a doctor if: You have wheezing, shortness of breath, or a cough even while taking medicine to prevent attacks. The mucus you cough up (sputum) is thicker than usual. The mucus you cough up changes from clear or white to yellow, green, gray, or is bloody. You have problems from the medicine you are taking, such as: A rash. Itching. Swelling. Trouble breathing. You need reliever medicines more than 2-3 times a week. Your peak flow reading is still at 50-79% of your personal best after following the action plan for 1 hour. You have a fever. Get help right away if: You seem to be worse and are not responding to medicine during an asthma attack. You are short of breath even at rest. You get short of breath when doing very little activity. You have trouble eating, drinking, or talking. You have chest  pain or tightness. You have a fast heartbeat. Your lips or fingernails start to turn blue. You are light-headed or dizzy, or you faint. Your peak flow is less than 50% of your personal best. You feel too tired to breathe normally. These symptoms may be an  emergency. Get help right away. Call 911. Do not wait to see if the symptoms will go away. Do not drive yourself to the hospital. Summary Asthma is a long-term (chronic) condition in which the airways get tight and narrow. An asthma attack can make it hard to breathe. Asthma cannot be cured, but medicines and lifestyle changes can help control it. Make sure you understand how to avoid triggers and how and when to use your medicines. Avoid things that can cause allergy symptoms (allergens). These include animal skin flakes (dander) and pollen from trees or grass. Avoid things that pollute the air. These may include household cleaners, wood smoke, smog, or chemical odors. This information is not intended to replace advice given to you by your health care provider. Make sure you discuss any questions you have with your health care provider. Document Revised: 03/28/2021 Document Reviewed: 03/28/2021 [employer] Patient Education  2024 [employer].

## 2024-05-15 ENCOUNTER — Other Ambulatory Visit: Payer: Self-pay | Admitting: Physician Assistant

## 2024-05-15 DIAGNOSIS — J4540 Moderate persistent asthma, uncomplicated: Secondary | ICD-10-CM

## 2024-12-19 ENCOUNTER — Ambulatory Visit: Payer: Self-pay | Admitting: Nurse Practitioner

## 2025-04-14 ENCOUNTER — Ambulatory Visit: Admitting: Internal Medicine
